# Patient Record
Sex: Female | Born: 1935 | Race: Black or African American | Hispanic: No | Marital: Married | Smoking: Former smoker
Health system: Southern US, Community
[De-identification: ages and names within clinical notes are randomized; demographics above are authoritative.]

## PROBLEM LIST (undated history)

## (undated) DIAGNOSIS — F329 Major depressive disorder, single episode, unspecified: Secondary | ICD-10-CM

## (undated) DIAGNOSIS — H269 Unspecified cataract: Secondary | ICD-10-CM

## (undated) DIAGNOSIS — F32A Depression, unspecified: Secondary | ICD-10-CM

## (undated) DIAGNOSIS — G309 Alzheimer's disease, unspecified: Secondary | ICD-10-CM

## (undated) DIAGNOSIS — G459 Transient cerebral ischemic attack, unspecified: Secondary | ICD-10-CM

## (undated) DIAGNOSIS — F0280 Dementia in other diseases classified elsewhere without behavioral disturbance: Secondary | ICD-10-CM

## (undated) DIAGNOSIS — I340 Nonrheumatic mitral (valve) insufficiency: Secondary | ICD-10-CM

## (undated) HISTORY — DX: Dementia in other diseases classified elsewhere, unspecified severity, without behavioral disturbance, psychotic disturbance, mood disturbance, and anxiety: F02.80

## (undated) HISTORY — DX: Nonrheumatic mitral (valve) insufficiency: I34.0

## (undated) HISTORY — PX: APPENDECTOMY: SHX54

## (undated) HISTORY — PX: BLADDER REPAIR: SHX76

## (undated) HISTORY — PX: TONSILLECTOMY: SUR1361

## (undated) HISTORY — PX: ABDOMINAL HYSTERECTOMY: SHX81

## (undated) HISTORY — DX: Major depressive disorder, single episode, unspecified: F32.9

## (undated) HISTORY — DX: Transient cerebral ischemic attack, unspecified: G45.9

## (undated) HISTORY — DX: Alzheimer's disease, unspecified: G30.9

## (undated) HISTORY — DX: Unspecified cataract: H26.9

## (undated) HISTORY — DX: Depression, unspecified: F32.A

---

## 2009-07-02 LAB — PROTIME-INR

## 2009-08-13 LAB — HM MAMMOGRAPHY: HM Mammogram: NEGATIVE

## 2010-01-23 LAB — PROTIME-INR

## 2010-07-16 LAB — PROTIME-INR

## 2010-09-16 LAB — PROTIME-INR

## 2011-03-17 LAB — PROTIME-INR

## 2011-04-17 LAB — PROTIME-INR

## 2011-08-21 LAB — PROTIME-INR

## 2012-06-07 LAB — PROTIME-INR

## 2013-10-30 LAB — PROTIME-INR

## 2013-12-22 LAB — PROTIME-INR

## 2014-02-28 LAB — PROTIME-INR

## 2014-03-20 LAB — PROTIME-INR

## 2014-04-11 LAB — PROTIME-INR

## 2014-05-25 LAB — PROTIME-INR

## 2014-06-07 LAB — PROTIME-INR

## 2014-08-20 LAB — PROTIME-INR

## 2014-09-11 LAB — PROTIME-INR

## 2014-10-02 LAB — PROTIME-INR

## 2014-11-14 LAB — PROTIME-INR: Protime: 37.5 seconds — AB (ref 10.0–13.8)

## 2014-11-15 ENCOUNTER — Telehealth: Payer: Self-pay | Admitting: *Deleted

## 2014-11-15 NOTE — Telephone Encounter (Signed)
Aram BeechamCynthia has addressed this.

## 2014-11-15 NOTE — Telephone Encounter (Signed)
Malyssa, daughter called regarding her mother. She stated that they have moved here from [place] and had her Coumadin drawn at a lab and are sending it to you. Informed her that we could not address until we saw the patient because she has not established with us yet. Offered another appointment with another Provider and she does not want to see another Dr. She wants to keep 01/28/15 with Dr. Renato Gailseed. Please Advise.

## 2014-11-21 ENCOUNTER — Ambulatory Visit: Payer: Self-pay | Admitting: Internal Medicine

## 2014-11-28 ENCOUNTER — Encounter: Payer: Self-pay | Admitting: Internal Medicine

## 2014-11-28 ENCOUNTER — Ambulatory Visit (INDEPENDENT_AMBULATORY_CARE_PROVIDER_SITE_OTHER): Payer: Federal, State, Local not specified - PPO | Admitting: Internal Medicine

## 2014-11-28 VITALS — BP 90/60 | HR 107 | Temp 98.5°F | Resp 18 | Ht 63.39 in | Wt 138.4 lb

## 2014-11-28 DIAGNOSIS — I340 Nonrheumatic mitral (valve) insufficiency: Secondary | ICD-10-CM

## 2014-11-28 DIAGNOSIS — Z7901 Long term (current) use of anticoagulants: Secondary | ICD-10-CM

## 2014-11-28 DIAGNOSIS — R630 Anorexia: Secondary | ICD-10-CM

## 2014-11-28 DIAGNOSIS — I82409 Acute embolism and thrombosis of unspecified deep veins of unspecified lower extremity: Secondary | ICD-10-CM

## 2014-11-28 NOTE — Progress Notes (Signed)
Patient ID: Faith Ortiz, female   DOB: November 10, 1932, 80 y.o.   MRN: [id number]     Facility  [facility]    Place of Service:   OFFICE   Allergies  Allergen Reactions  . Contrast Media [Iodinated Diagnostic Agents] Other (See Comments)    Weakness,   . Darvon [Propoxyphene] Other (See Comments)    Weakness, Fainting  . Penicillins Rash    Chief Complaint  Patient presents with  . Acute Visit    dizzy spells, weeakness , not wantint to get out of bed    HPI:  80 yo female presents today for an acute visit. She takes coumadin for hx recurrent DVTs. She had an INR drawn last week at [facility] that was >3. No foods high in Vit K. Feels nauseated and tired. No SOB. Daughter present.  Daughter reports pt has chronic dizziness, weakness and fatigue.  Medications: Patient's Medications  New Prescriptions   No medications on file  Previous Medications   DULOXETINE (CYMBALTA) 60 MG CAPSULE    Take 60 mg by mouth daily.   MEMANTINE (NAMENDA) 10 MG TABLET    Take 10 mg by mouth 2 (two) times daily.   POTASSIUM CHLORIDE (KLOR-CON) 8 MEQ TABLET    Take 8 mEq by mouth daily.   SIMVASTATIN (ZOCOR) 10 MG TABLET    Take 10 mg by mouth daily.   TRAZODONE (DESYREL) 150 MG TABLET    Take 150 mg by mouth at bedtime.   WARFARIN (COUMADIN) 4 MG TABLET    Take 4 mg by mouth at bedtime.  Modified Medications   No medications on file  Discontinued Medications   No medications on file     Review of Systems   As above. All other systems negative.  Filed Vitals:   11/28/14 1007  BP: 90/60  Pulse: 107  Temp: 98.5 F (36.9 C)  TempSrc: Oral  Resp: 18  Height: 5' 3.39" (1.61 m)  Weight: 138 lb 6.4 oz (62.778 kg)  SpO2: 98%   Body mass index is 24.22 kg/(m^2).  Physical Exam CONSTITUTIONAL: Looks well in NAD. Awake, alert and oriented x 3. Daughter present HEENT: PERRLA. Oropharynx clear and without exudate NECK: Supple. Nontender. No palpable cervical or supraclavicular lymph nodes. (+)carotid  bruit b/l. No thyromegaly or thyroid mass palpable.  CVS: Regular rate with frequent ectopy with 2/6 SEmurmur. No gallop or rub. LUNGS: CTA b/l no wheezing, rales or rhonchi. ABDOMEN: Bowel sounds present x 4. Soft, nondistended. No palpable mass or bruit. LLQ TTP but no r/g/r EXTREMITIES: No edema b/l. Distal pulses palpable. No calf tenderness. (+) right calf fullness. PSYCH: Affect, behavior and mood normal   Labs reviewed: No results found for any previous visit.   Assessment/Plan    ICD-9-CM ICD-10-CM   1. DVT, lower extremity, recurrent, unspecified laterality 453.40 I82.409 CBC with Differential     Protime-INR  2. Chronic anticoagulation V58.61 Z79.01 CBC with Differential  3. Mitral valve regurgitation 424.0 I34.0   4. Decreased appetite 783.0 R63.0    - check INR and CBC w diff today. Dose coumadin accordingly  - no other medication changes  -f/u with Dr Renato Gailseed as scheduled in Feb 2016    [facility]. Ancil Linseyarter, D. O., F. A. C. O. I.  [facility] [address] [place], [place] [address] [phone] Office (Wednesdays and Fridays 8 AM - 5 PM) [phone] Cell (Monday-Friday 8 AM - 5 PM)

## 2014-11-28 NOTE — Patient Instructions (Signed)
Follow up with Dr Renato Gailseed as scheduled  Continue all other medications as prescribed

## 2014-11-29 LAB — CBC WITH DIFFERENTIAL/PLATELET
Basophils Absolute: 0 10*3/uL (ref 0.0–0.2)
Basos: 0 %
EOS: 2 %
Eosinophils Absolute: 0.1 10*3/uL (ref 0.0–0.4)
HEMATOCRIT: 30.1 % — AB (ref 34.0–46.6)
HEMOGLOBIN: 9.8 g/dL — AB (ref 11.1–15.9)
IMMATURE GRANULOCYTES: 0 %
Immature Grans (Abs): 0 10*3/uL (ref 0.0–0.1)
LYMPHS ABS: 2 10*3/uL (ref 0.7–3.1)
Lymphs: 30 %
MCH: 28.2 pg (ref 26.6–33.0)
MCHC: 32.6 g/dL (ref 31.5–35.7)
MCV: 87 fL (ref 79–97)
MONOS ABS: 0.4 10*3/uL (ref 0.1–0.9)
Monocytes: 6 %
Neutrophils Absolute: 4 10*3/uL (ref 1.4–7.0)
Neutrophils Relative %: 62 %
RBC: 3.48 x10E6/uL — ABNORMAL LOW (ref 3.77–5.28)
RDW: 13.4 % (ref 12.3–15.4)
WBC: 6.5 10*3/uL (ref 3.4–10.8)

## 2014-11-29 LAB — PROTIME-INR
INR: 3.4 — AB (ref 0.8–1.2)
Prothrombin Time: 35.8 s — ABNORMAL HIGH (ref 9.1–12.0)

## 2014-12-03 ENCOUNTER — Other Ambulatory Visit: Payer: Self-pay | Admitting: *Deleted

## 2014-12-03 LAB — POCT INR: INR: 3.4 — AB (ref 0.9–1.1)

## 2014-12-03 MED ORDER — WARFARIN SODIUM 3 MG PO TABS: ORAL_TABLET | ORAL | Status: DC

## 2014-12-03 NOTE — Telephone Encounter (Signed)
Protime Results from [facility] 12/03/2014  INR:  3.4 PT:  37.5  Current Dose of Coumadin:   once daily Per Jessica---Hold today and restart  tomorrow and see Lynden Ang on Monday for repeat Patient daughter Notified and appointment scheduled for Monday 1/11 to see [facility].

## 2014-12-05 ENCOUNTER — Other Ambulatory Visit: Payer: Self-pay | Admitting: *Deleted

## 2014-12-05 MED ORDER — SIMVASTATIN 10 MG PO TABS: ORAL_TABLET | ORAL | Status: DC

## 2014-12-05 NOTE — Telephone Encounter (Signed)
[facility] 

## 2014-12-06 ENCOUNTER — Other Ambulatory Visit: Payer: Self-pay

## 2014-12-07 ENCOUNTER — Other Ambulatory Visit: Payer: Federal, State, Local not specified - PPO

## 2014-12-07 ENCOUNTER — Telehealth: Payer: Self-pay | Admitting: *Deleted

## 2014-12-07 DIAGNOSIS — I82409 Acute embolism and thrombosis of unspecified deep veins of unspecified lower extremity: Secondary | ICD-10-CM

## 2014-12-07 DIAGNOSIS — Z7901 Long term (current) use of anticoagulants: Secondary | ICD-10-CM

## 2014-12-07 LAB — PROTIME-INR
INR: 1.8 — AB (ref 0.8–1.2)
PROTHROMBIN TIME: 19 s — AB (ref 9.1–12.0)
Protime: 19 seconds — AB (ref 10.0–13.8)

## 2014-12-07 LAB — POCT INR: INR: 1.8 — AB (ref 0.9–1.1)

## 2014-12-07 NOTE — Telephone Encounter (Signed)
[facility] Protime Results from 12/07/2014  INR:  1.8 PT:  19.0  Current Dose of Coumadin: 3mg  daily Per Dr. Gar Pontoarter--Take 6mg  Coumadin today and tomorrow then resume 3mg  dose on 12/09/14. Recheck INR on Monday 12/10/2014. Patient daughter Notified and agreed and confirmed Monday's appointment with [name].

## 2014-12-10 ENCOUNTER — Encounter: Payer: Self-pay | Admitting: Pharmacotherapy

## 2014-12-10 ENCOUNTER — Ambulatory Visit (INDEPENDENT_AMBULATORY_CARE_PROVIDER_SITE_OTHER): Payer: Federal, State, Local not specified - PPO | Admitting: Pharmacotherapy

## 2014-12-10 VITALS — BP 120/74 | HR 74 | Temp 98.1°F | Wt 137.0 lb

## 2014-12-10 DIAGNOSIS — I82409 Acute embolism and thrombosis of unspecified deep veins of unspecified lower extremity: Secondary | ICD-10-CM

## 2014-12-10 DIAGNOSIS — Z5181 Encounter for therapeutic drug level monitoring: Secondary | ICD-10-CM

## 2014-12-10 DIAGNOSIS — IMO0001 Reserved for inherently not codable concepts without codable children: Secondary | ICD-10-CM | POA: Insufficient documentation

## 2014-12-10 DIAGNOSIS — Z7901 Long term (current) use of anticoagulants: Secondary | ICD-10-CM

## 2014-12-10 DIAGNOSIS — I82509 Chronic embolism and thrombosis of unspecified deep veins of unspecified lower extremity: Secondary | ICD-10-CM

## 2014-12-10 LAB — POCT INR: INR: 2.3

## 2014-12-10 MED ORDER — WARFARIN SODIUM 4 MG PO TABS
4.0000 mg | ORAL_TABLET | Freq: Every day | ORAL | Status: DC
Start: 2014-12-10 — End: 2015-06-07

## 2014-12-10 NOTE — Progress Notes (Signed)
   Subjective:    Patient ID: Faith Ortiz, female    DOB: 26-Jul-1932, 81 y.o.   MRN: [id number]  HPI Last INR on 12/07/14 was low at 1.8 Coumadin was doubled x 2 days, then she returned for INR check. She had been stable on 4mg  daily while living in [place]. Denies unusual bleeding or bruising. Denies CP, SOB, fall Consistent with vitamin K intake.   Review of Systems  HENT: Negative for nosebleeds.   Respiratory: Negative for shortness of breath.   Cardiovascular: Negative for chest pain.  Gastrointestinal: Negative for blood in stool and anal bleeding.  Genitourinary: Negative for hematuria.  Hematological: Does not bruise/bleed easily.       Objective:   Physical Exam  Constitutional: She is oriented to person, place, and time. She appears well-developed and well-nourished.  HENT:  Right Ear: External ear normal.  Left Ear: External ear normal.  Cardiovascular: Normal rate, regular rhythm and normal heart sounds.   Pulmonary/Chest: Effort normal and breath sounds normal.  Neurological: She is alert and oriented to person, place, and time.  Skin: Skin is warm and dry.  Psychiatric: She has a normal mood and affect. Her behavior is normal. Judgment and thought content normal.  Vitals reviewed.   BP:  120/74  HR:  74,  Wt:  137lb  INR 2.3       Assessment & Plan:  1.  INR at goal 2-3 2.  Restart Coumadin 4mg  daily 3.  RTC 1 week

## 2014-12-10 NOTE — Patient Instructions (Signed)
INR 2.3 Start Coumadin 4mg  daily

## 2014-12-17 ENCOUNTER — Encounter: Payer: Self-pay | Admitting: Internal Medicine

## 2014-12-17 ENCOUNTER — Ambulatory Visit: Payer: Federal, State, Local not specified - PPO | Admitting: Pharmacotherapy

## 2014-12-24 ENCOUNTER — Ambulatory Visit (INDEPENDENT_AMBULATORY_CARE_PROVIDER_SITE_OTHER): Payer: Federal, State, Local not specified - PPO | Admitting: Pharmacotherapy

## 2014-12-24 ENCOUNTER — Encounter: Payer: Self-pay | Admitting: Pharmacotherapy

## 2014-12-24 VITALS — BP 122/70 | HR 84 | Temp 98.4°F | Wt 136.0 lb

## 2014-12-24 DIAGNOSIS — I82409 Acute embolism and thrombosis of unspecified deep veins of unspecified lower extremity: Secondary | ICD-10-CM

## 2014-12-24 DIAGNOSIS — I82509 Chronic embolism and thrombosis of unspecified deep veins of unspecified lower extremity: Secondary | ICD-10-CM

## 2014-12-24 DIAGNOSIS — Z5181 Encounter for therapeutic drug level monitoring: Secondary | ICD-10-CM

## 2014-12-24 DIAGNOSIS — IMO0001 Reserved for inherently not codable concepts without codable children: Secondary | ICD-10-CM

## 2014-12-24 DIAGNOSIS — Z7901 Long term (current) use of anticoagulants: Secondary | ICD-10-CM

## 2014-12-24 LAB — POCT INR: INR: 2.1

## 2014-12-24 NOTE — Progress Notes (Signed)
   Subjective:    Patient ID: Faith Ortiz, female    DOB: 1932-04-04, 81 y.o.   MRN: [id number]  HPI Her current Coumadin dose is 4mg  daily. Denies missed doses. Denies unusual bleeding or bruising. Denies CP, SOB, falls Consistent with vitamin K intake   Review of Systems  HENT: Negative for nosebleeds.   Respiratory: Negative for shortness of breath.   Cardiovascular: Negative for chest pain and palpitations.  Gastrointestinal: Negative for blood in stool and anal bleeding.  Genitourinary: Negative for hematuria.  Hematological: Does not bruise/bleed easily.       Objective:   Physical Exam  Constitutional: She is oriented to person, place, and time. She appears well-developed and well-nourished.  HENT:  Right Ear: External ear normal.  Left Ear: External ear normal.  Cardiovascular: Normal rate, regular rhythm and normal heart sounds.   Pulmonary/Chest: Effort normal and breath sounds normal.  Neurological: She is alert and oriented to person, place, and time.  Skin: Skin is warm and dry.  Psychiatric: She has a normal mood and affect. Her behavior is normal. Judgment and thought content normal.  Vitals reviewed.  BP:  122/70  HR  84  Wt:  136lb INR 2.1       Assessment & Plan:  1.  INR at goal 2-3 2.  Continue Coumadin 4mg  daily 3.  RTC in 1 month

## 2014-12-24 NOTE — Patient Instructions (Signed)
INR 2.1 Continue Coumadin 4mg  daily.

## 2015-01-24 ENCOUNTER — Encounter: Payer: Self-pay | Admitting: Internal Medicine

## 2015-01-28 ENCOUNTER — Ambulatory Visit: Payer: Self-pay | Admitting: Internal Medicine

## 2015-02-04 ENCOUNTER — Encounter: Payer: Self-pay | Admitting: Internal Medicine

## 2015-02-04 ENCOUNTER — Ambulatory Visit (INDEPENDENT_AMBULATORY_CARE_PROVIDER_SITE_OTHER): Payer: Federal, State, Local not specified - PPO | Admitting: Internal Medicine

## 2015-02-04 VITALS — BP 122/74 | HR 84 | Temp 98.2°F | Resp 18 | Ht 63.3 in | Wt 135.0 lb

## 2015-02-04 DIAGNOSIS — F0280 Dementia in other diseases classified elsewhere without behavioral disturbance: Secondary | ICD-10-CM

## 2015-02-04 DIAGNOSIS — E2839 Other primary ovarian failure: Secondary | ICD-10-CM | POA: Diagnosis not present

## 2015-02-04 DIAGNOSIS — Z8673 Personal history of transient ischemic attack (TIA), and cerebral infarction without residual deficits: Secondary | ICD-10-CM

## 2015-02-04 DIAGNOSIS — R630 Anorexia: Secondary | ICD-10-CM | POA: Diagnosis not present

## 2015-02-04 DIAGNOSIS — E785 Hyperlipidemia, unspecified: Secondary | ICD-10-CM | POA: Insufficient documentation

## 2015-02-04 DIAGNOSIS — I340 Nonrheumatic mitral (valve) insufficiency: Secondary | ICD-10-CM

## 2015-02-04 DIAGNOSIS — I82509 Chronic embolism and thrombosis of unspecified deep veins of unspecified lower extremity: Secondary | ICD-10-CM | POA: Diagnosis not present

## 2015-02-04 DIAGNOSIS — Z23 Encounter for immunization: Secondary | ICD-10-CM

## 2015-02-04 DIAGNOSIS — G309 Alzheimer's disease, unspecified: Secondary | ICD-10-CM | POA: Diagnosis not present

## 2015-02-04 DIAGNOSIS — Z5181 Encounter for therapeutic drug level monitoring: Secondary | ICD-10-CM | POA: Diagnosis not present

## 2015-02-04 DIAGNOSIS — G4700 Insomnia, unspecified: Secondary | ICD-10-CM | POA: Diagnosis not present

## 2015-02-04 DIAGNOSIS — E079 Disorder of thyroid, unspecified: Secondary | ICD-10-CM | POA: Diagnosis not present

## 2015-02-04 LAB — POCT INR: INR: 2.9

## 2015-02-04 MED ORDER — ZOSTER VACCINE LIVE 19400 UNT/0.65ML ~~LOC~~ SOLR: 0.6500 mL | Freq: Once | SUBCUTANEOUS | Status: DC

## 2015-02-04 NOTE — Patient Instructions (Addendum)
Please obtain copy of the colonoscopy report from 10 years ago.    Please get a zostavax vaccination at the pharmacy.  Please bring us a copy of your health care power of attorney/living will paperwork.

## 2015-02-04 NOTE — Progress Notes (Signed)
Patient ID: Faith Ortiz, female   DOB: 1932-10-15, 81 y.o.   MRN: [id number]   Location:  [facility] [place]  Code Status: full code Advanced Directive information Does patient have an advance directive?: No   Allergies  Allergen Reactions  . Codeine   . Contrast Media [Iodinated Diagnostic Agents] Other (See Comments)    Weakness,   . Darvon [Propoxyphene] Other (See Comments)    Weakness, Fainting  . Wine [Alcohol]   . Penicillins Rash    Chief Complaint  Patient presents with  . Establish Care    New patient establish care  . Shortness of Breath    patient complain of shortness of breath  . Coagulation Disorder    PT/INR check     HPI: Patient is a 81 y.o. female seen in the office today to establish with the practice.  She has h/o Alzheimer's disease, mitral regurgitation, recurrent DVT on coumadin with INR goal 2-3, and depression.  INR 2.9 today--at goal.  Will plan to check again in 1 month.    Some short term memory loss.  Lives with her daughter--came 10/05/14.  She helps her to get her food, medications in pillbox.  Showers on her own and gets dressed.  Sometimes her daughter helps her get her clothes together when she is out of breath due to the leaky mitral valve.   Surgery was recommended 2 years ago for leaky valve, but DR. Zoila Shutter and family decided that it was not in her best interest to have such a surgery.  Walking will get her sob, getting dressed--has to sit down.  Does not exercise aside from walking.  Her daughter points out that she likes to sit in bed all day.  They have their coffee in the recliner, then walk around the living room a few times to get going.  They couldn't get the namenda XR so far.  They do not recall her ever taking donepezil.    Had incident 4 days ago, went unresponsive--stopped talking suddenly, head tilted back and eyes rolled back in head.  Came to after ems came.  Is allergic to wine and she had  eaten shrimp cooked in wine.  O2 levels were normal.  Has no appetite--takes boost twice a day for 7 years.  Loves lobster bisque, chicken wing here and there.  Fruit smoothies.  Drinks a lot of chocolate milk which fills her up.  Eats PB and crackers twice a month.    Mood is good.  Has been on cymbalta also for a long time.    Does not get chest pains.  No sensation of bad indigestion.   Had cataracts done about a year ago.  Had left eye checked. Doesn't run a lot, but some and she rubs it.  Has not yet established with ophthalmologist.     Has been on same cholesterol med.  Had a "mini stroke" 10-15 years ago--sounds like she had a TIA.  They don't recall what symptoms she had.  She's been on coumadin since then.  No h/o afib.  She is 16th of 18 siblings. Is a homebody.    Would not want intervention if she had breast cancer discovered.    Had a cscope 10 years ago.  Says she has formed stools.  We need the report.  Review of Systems:  Review of Systems  Constitutional: Negative for fever, chills, weight loss and diaphoresis.       Poor appetite  HENT:       Has partial and currently has loose tooth they are considering getting extracted  Eyes:       Left eye cataract removed in 2015 and now runny eye  Respiratory: Positive for shortness of breath. Negative for cough and wheezing.   Cardiovascular: Negative for chest pain, palpitations, leg swelling and PND.       Leaky mitral valve--last echo in chart 2012  Gastrointestinal: Negative for constipation, blood in stool and melena.       H/o colitis  Genitourinary: Negative for dysuria, urgency and frequency.  Musculoskeletal: Negative for falls.  Skin: Negative for itching and rash.  Neurological:       H/o TIA  Endo/Heme/Allergies:       Agranular cytosis?  Psychiatric/Behavioral: Positive for depression and memory loss. The patient is nervous/anxious and has insomnia.        Confusion; is afraid to be home alone due to her  husband being murdered    Past Medical History  Diagnosis Date  . Cataract 2011-2013    left  . Alzheimer disease   . Mitral regurgitation   . Depression   . TIA (transient ischemic attack)     Past Surgical History  Procedure Laterality Date  . Abdominal hysterectomy    . Bladder repair    . Cesarean section    . Cesarean section    . Cesarean section    . Tonsillectomy    . Appendectomy      Social History:   reports that she has quit smoking. Her smoking use included Cigarettes. She smoked 0.00 packs per day for 5 years. She has never used smokeless tobacco. She reports that she does not drink alcohol or use illicit drugs.  Family History  Problem Relation Age of Onset  . Diabetes Father   . Arthritis Mother   . Breast cancer Sister   . Breast cancer Sister   . Stroke Sister   . Diabetes Brother   . Lung disease Brother   . Sarcoidosis Son 1762  . Arthritis Son 2460  . Gout Son 60  . Diabetes Daughter 2259    Medications: Patient's Medications  New Prescriptions   No medications on file  Previous Medications   DULOXETINE (CYMBALTA) 60 MG CAPSULE    Take 60 mg by mouth daily.   MEMANTINE (NAMENDA) 10 MG TABLET    Take 10 mg by mouth 2 (two) times daily.   POTASSIUM CHLORIDE (KLOR-CON) 8 MEQ TABLET    Take 8 mEq by mouth daily.   SIMVASTATIN (ZOCOR) 10 MG TABLET    Take one tablet by mouth once daily for cholesterol   TRAZODONE (DESYREL) 150 MG TABLET    Take 150 mg by mouth at bedtime.   WARFARIN (COUMADIN) 4 MG TABLET    Take 1 tablet (4 mg total) by mouth daily.  Modified Medications   No medications on file  Discontinued Medications   No medications on file     Physical Exam: Filed Vitals:   02/04/15 0849  BP: 122/74  Pulse: 84  Temp: 98.2 F (36.8 C)  Resp: 18  Height: 5' 3.3" (1.608 m)  Weight: 135 lb (61.236 kg)  SpO2: 98%  Physical Exam  Constitutional: She is oriented to person, place, and time. She appears well-developed and well-nourished.  No distress.  Cardiovascular: Normal rate, regular rhythm and intact distal pulses.   Murmur heard. Pulmonary/Chest: Effort normal and breath sounds normal. She has no rales.  Abdominal: Soft. Bowel sounds are normal. She exhibits no distension and no mass. There is no tenderness.  Musculoskeletal: Normal range of motion. She exhibits no tenderness.  Neurological: She is alert and oriented to person, place, and time.  Skin: Skin is warm and dry.  Psychiatric: She has a normal mood and affect.    Labs reviewed: CBC:  Recent Labs  11/28/14 1101  WBC 6.5  NEUTROABS 4.0  HGB 9.8*  HCT 30.1*  MCV 87   Assessment/Plan 1. Chronic deep vein thrombosis of lower extremity, unspecified laterality - initial diagnosis date unclear--pt has been on coumadin since she had a TIA, but is in sinus rhythm now and on EKGs on record - POC INR - CBC with Differential/Platelet; Future - Protime-INR; Future  2. Encounter for therapeutic drug monitoring - will have her return for labs with INR in 1 month; today INR 2.9 on  coumadin daily - POC INR - Protime-INR; Future  3. Decreased appetite - has been going on for several years, takes boost and smoothies, some other rich items like lobster bisque - Comprehensive metabolic panel; Future - Prealbumin; Future  4. Alzheimer disease -cont namenda--her pharmacy did not have XR which is the only reason she is on bid namenda -due to her appetite and weight loss historically, would avoid aricept in her  5. Mitral regurgitation -has some dyspnea on exertion related to this -last echo was 2012 and stable -discussed repeat, but will not change mgt b/c she does not want surgery -POX checked seated (98%) and with exertion (96%)  6. Hyperlipidemia - cont statin therapy - Comprehensive metabolic panel; Future - Lipid panel; Future  7. H/O TIA (transient ischemic attack)  -pt and her daughter are unable to tell what her symptoms were at the time  just that there were no residual neurological deficits  8. Insomnia -cont trazodone  at bedtime  9. Need for zoster vaccination -Rx provided to get vaccine at the pharmacy - zoster vaccine live, PF, (ZOSTAVAX) [address] UNT/0.65ML injection; Inject 19,400 Units into the skin once.  Dispense: 1 each; Refill: 0  10. Estrogen deficiency -will obtain bone density and vitamin D level--she has not had the bone density ever before that they know of and not seen in records - Vitamin D, 25-hydroxy; Future - DG Bone Density; Future  11. Thyroid condition -pt thinks she was hyperthyroid in the past so will reassess TSH - TSH; Future  Labs/tests ordered:   Orders Placed This Encounter  Procedures  . DG Bone Density    Standing Status: Future     Number of Occurrences:      Standing Expiration Date: 04/05/2016    Order Specific Question:  Reason for Exam (SYMPTOM  OR DIAGNOSIS REQUIRED)    Answer:  screening; estrogen deficiency; postmenopausal    Order Specific Question:  Preferred imaging location?    Answer:  [facility]  . CBC with Differential/Platelet    Standing Status: Future     Number of Occurrences:      Standing Expiration Date: 05/07/2015  . Comprehensive metabolic panel    Standing Status: Future     Number of Occurrences:      Standing Expiration Date: 05/07/2015    Order Specific Question:  Has the patient fasted?    Answer:  Yes  . Lipid panel    Standing Status: Future     Number of Occurrences:      Standing Expiration Date: 05/07/2015    Order Specific Question:  Has the patient fasted?    Answer:  Yes  . Vitamin D, 25-hydroxy    Standing Status: Future     Number of Occurrences:      Standing Expiration Date: 05/07/2015  . Prealbumin    Standing Status: Future     Number of Occurrences:      Standing Expiration Date: 05/07/2015  . TSH    Standing Status: Future     Number of Occurrences:      Standing Expiration Date: 05/07/2015  . Protime-INR    Standing  Status: Future     Number of Occurrences:      Standing Expiration Date: 02/04/2016  . POC INR    Next appt:  1 month for labs; 3 mos for visit with me  Faith Ortiz, D.O. Geriatrics [facility] [address], [place] [address] Cell Phone (Mon-Fri 8am-5pm):  [phone] On Call:  [phone] & follow prompts after 5pm & weekends Office Phone:  [phone] Office Fax:  [phone]

## 2015-02-28 ENCOUNTER — Ambulatory Visit
Admission: RE | Admit: 2015-02-28 | Discharge: 2015-02-28 | Disposition: A | Discharge status: Home / Self Care | Payer: Federal, State, Local not specified - PPO | Source: Ambulatory Visit | Attending: Internal Medicine | Admitting: Internal Medicine

## 2015-02-28 ENCOUNTER — Other Ambulatory Visit: Payer: Federal, State, Local not specified - PPO

## 2015-02-28 DIAGNOSIS — E2839 Other primary ovarian failure: Secondary | ICD-10-CM

## 2015-03-07 ENCOUNTER — Other Ambulatory Visit: Payer: Self-pay | Admitting: *Deleted

## 2015-03-07 MED ORDER — MEMANTINE HCL 10 MG PO TABS: ORAL_TABLET | ORAL | Status: DC

## 2015-03-07 NOTE — Telephone Encounter (Signed)
[facility] Order

## 2015-03-08 ENCOUNTER — Other Ambulatory Visit: Payer: Self-pay | Admitting: *Deleted

## 2015-03-08 MED ORDER — MEMANTINE HCL 10 MG PO TABS: ORAL_TABLET | ORAL | Status: DC

## 2015-03-08 NOTE — Telephone Encounter (Signed)
Patient requested to be faxed to [facility]. Faxed

## 2015-03-11 ENCOUNTER — Other Ambulatory Visit: Payer: Self-pay | Admitting: Internal Medicine

## 2015-03-11 ENCOUNTER — Other Ambulatory Visit: Payer: Federal, State, Local not specified - PPO

## 2015-03-11 NOTE — Telephone Encounter (Signed)
Patient requested to be faxed to pharmacy. Reminded patient of Lab appointment tomorrow.

## 2015-03-12 ENCOUNTER — Other Ambulatory Visit: Payer: Federal, State, Local not specified - PPO

## 2015-03-12 DIAGNOSIS — I82509 Chronic embolism and thrombosis of unspecified deep veins of unspecified lower extremity: Secondary | ICD-10-CM

## 2015-03-12 DIAGNOSIS — R630 Anorexia: Secondary | ICD-10-CM

## 2015-03-12 DIAGNOSIS — E2839 Other primary ovarian failure: Secondary | ICD-10-CM

## 2015-03-12 DIAGNOSIS — E785 Hyperlipidemia, unspecified: Secondary | ICD-10-CM

## 2015-03-12 DIAGNOSIS — Z5181 Encounter for therapeutic drug level monitoring: Secondary | ICD-10-CM

## 2015-03-12 DIAGNOSIS — E079 Disorder of thyroid, unspecified: Secondary | ICD-10-CM

## 2015-03-13 LAB — COMPREHENSIVE METABOLIC PANEL
ALT: 12 IU/L (ref 0–32)
AST: 23 IU/L (ref 0–40)
Albumin/Globulin Ratio: 1.5 (ref 1.1–2.5)
Albumin: 4 g/dL (ref 3.5–4.7)
Alkaline Phosphatase: 61 IU/L (ref 39–117)
BUN/Creatinine Ratio: 16 (ref 11–26)
BUN: 12 mg/dL (ref 8–27)
Bilirubin Total: <0.2 mg/dL (ref 0.0–1.2)
CO2: 21 mmol/L (ref 18–29)
Calcium: 9.2 mg/dL (ref 8.7–10.3)
Chloride: 101 mmol/L (ref 97–108)
Creatinine, Ser: 0.76 mg/dL (ref 0.57–1.00)
GFR calc Af Amer: 85 mL/min/{1.73_m2} (ref >59)
GFR calc non Af Amer: 74 mL/min/{1.73_m2} (ref >59)
Globulin, Total: 2.7 g/dL (ref 1.5–4.5)
Glucose: 125 mg/dL — ABNORMAL HIGH (ref 65–99)
Potassium: 3.6 mmol/L (ref 3.5–5.2)
Sodium: 140 mmol/L (ref 134–144)
Total Protein: 6.7 g/dL (ref 6.0–8.5)

## 2015-03-13 LAB — CBC WITH DIFFERENTIAL/PLATELET
Basophils Absolute: 0 10*3/uL (ref 0.0–0.2)
Basos: 0 %
Eos: 3 %
Eosinophils Absolute: 0.1 10*3/uL (ref 0.0–0.4)
HCT: 34.2 % (ref 34.0–46.6)
Hemoglobin: 10.1 g/dL — ABNORMAL LOW (ref 11.1–15.9)
Immature Grans (Abs): 0 10*3/uL (ref 0.0–0.1)
Immature Granulocytes: 0 %
Lymphocytes Absolute: 1.7 10*3/uL (ref 0.7–3.1)
Lymphs: 52 %
MCH: 21.4 pg — ABNORMAL LOW (ref 26.6–33.0)
MCHC: 29.5 g/dL — ABNORMAL LOW (ref 31.5–35.7)
MCV: 72 fL — ABNORMAL LOW (ref 79–97)
Monocytes Absolute: 0.2 10*3/uL (ref 0.1–0.9)
Monocytes: 5 %
Neutrophils Absolute: 1.3 10*3/uL — ABNORMAL LOW (ref 1.4–7.0)
Neutrophils Relative %: 40 %
Platelets: 358 10*3/uL (ref 150–379)
RBC: 4.73 x10E6/uL (ref 3.77–5.28)
RDW: 17.6 % — ABNORMAL HIGH (ref 12.3–15.4)
WBC: 3.3 10*3/uL — ABNORMAL LOW (ref 3.4–10.8)

## 2015-03-13 LAB — LIPID PANEL
Chol/HDL Ratio: 3.1 ratio units (ref 0.0–4.4)
Cholesterol, Total: 202 mg/dL — ABNORMAL HIGH (ref 100–199)
HDL: 66 mg/dL (ref >39)
LDL Calculated: 108 mg/dL — ABNORMAL HIGH (ref 0–99)
Triglycerides: 139 mg/dL (ref 0–149)
VLDL Cholesterol Cal: 28 mg/dL (ref 5–40)

## 2015-03-13 LAB — PROTIME-INR
INR: 2.6 — ABNORMAL HIGH (ref 0.8–1.2)
Prothrombin Time: 27.5 s — ABNORMAL HIGH (ref 9.1–12.0)

## 2015-03-13 LAB — PREALBUMIN: Prealbumin: 21 mg/dL (ref 9–31)

## 2015-03-13 LAB — VITAMIN D 25 HYDROXY (VIT D DEFICIENCY, FRACTURES): Vit D, 25-Hydroxy: 29.9 ng/mL — ABNORMAL LOW (ref 30.0–100.0)

## 2015-03-13 LAB — TSH: TSH: 2.31 u[IU]/mL (ref 0.450–4.500)

## 2015-04-15 ENCOUNTER — Other Ambulatory Visit: Payer: Self-pay | Admitting: *Deleted

## 2015-04-15 MED ORDER — DULOXETINE HCL 60 MG PO CPEP: 60.0000 mg | ORAL_CAPSULE | Freq: Every day | ORAL | Status: DC

## 2015-05-09 ENCOUNTER — Encounter: Payer: Self-pay | Admitting: Internal Medicine

## 2015-05-09 ENCOUNTER — Ambulatory Visit: Payer: Federal, State, Local not specified - PPO | Admitting: Internal Medicine

## 2015-05-09 DIAGNOSIS — Z0289 Encounter for other administrative examinations: Secondary | ICD-10-CM

## 2015-05-28 ENCOUNTER — Other Ambulatory Visit: Payer: Self-pay | Admitting: *Deleted

## 2015-05-28 ENCOUNTER — Telehealth: Payer: Self-pay | Admitting: *Deleted

## 2015-05-28 ENCOUNTER — Other Ambulatory Visit: Payer: Federal, State, Local not specified - PPO

## 2015-05-28 DIAGNOSIS — I82409 Acute embolism and thrombosis of unspecified deep veins of unspecified lower extremity: Secondary | ICD-10-CM

## 2015-05-28 NOTE — Telephone Encounter (Signed)
Patient caregiver called and stated that patient needs a lab appointment for INR check. I checked patient's chart and she needs a appointment to be seen. Has canceled several appointment. Explained to her that we need to make an appointment for patient to be seen and she stated that all she needs is bloodwork. Told her that provider needs to follow up with patient. She agreed and made appointment but also wanted to go ahead and make a protime lab appointment also. Scheduled for both.

## 2015-05-29 LAB — PROTIME-INR
INR: 3.1 — ABNORMAL HIGH (ref 0.8–1.2)
Prothrombin Time: 32.5 s — ABNORMAL HIGH (ref 9.1–12.0)

## 2015-05-30 ENCOUNTER — Ambulatory Visit: Payer: Federal, State, Local not specified - PPO | Admitting: Internal Medicine

## 2015-05-31 ENCOUNTER — Encounter: Payer: Self-pay | Admitting: Internal Medicine

## 2015-05-31 ENCOUNTER — Ambulatory Visit (INDEPENDENT_AMBULATORY_CARE_PROVIDER_SITE_OTHER): Payer: Federal, State, Local not specified - PPO | Admitting: Internal Medicine

## 2015-05-31 VITALS — BP 110/80 | HR 91 | Temp 98.6°F | Resp 18 | Ht 63.0 in | Wt 130.4 lb

## 2015-05-31 DIAGNOSIS — G309 Alzheimer's disease, unspecified: Secondary | ICD-10-CM | POA: Diagnosis not present

## 2015-05-31 DIAGNOSIS — E785 Hyperlipidemia, unspecified: Secondary | ICD-10-CM | POA: Diagnosis not present

## 2015-05-31 DIAGNOSIS — F3341 Major depressive disorder, recurrent, in partial remission: Secondary | ICD-10-CM

## 2015-05-31 DIAGNOSIS — R630 Anorexia: Secondary | ICD-10-CM

## 2015-05-31 DIAGNOSIS — F0280 Dementia in other diseases classified elsewhere without behavioral disturbance: Secondary | ICD-10-CM

## 2015-05-31 DIAGNOSIS — I82509 Chronic embolism and thrombosis of unspecified deep veins of unspecified lower extremity: Secondary | ICD-10-CM

## 2015-05-31 NOTE — Progress Notes (Signed)
Patient ID: Faith Ortiz, female   DOB: 09-02-1932, 81 y.o.   MRN: [id number]   Location:  [facility]   Goals of Care: Advanced Directive information Does patient have an advance directive?: No, Would patient like information on creating an advanced directive?: Yes - Educational materials given (and they were working on it last visit; need to ask next time if completed so they can bring copy to be scanned)   Chief Complaint  Patient presents with  . Medical Management of Chronic Issues    HPI: Patient is a 81 y.o. black female seen in the office today for med mgt of chronic diseases.  Her daughter brought her in today.  Pt does not want the shingles vaccine.  Still has decreased appetite.  Has the two boosts per day.  Eats some chicken wings and vegetables and soft fish occasionally.  Has lost 5 more lbs since last visit.  Was drinking less chocolate milk and fewer strawberry shakes.  Has twice weekly smoothie with veggie.    Would sleep forever if she could. Remains depressed somewhat but much better than it once was.    Her daughter is now home  5 days per week. She seems to want to try changing her medications, but the patient wants to see if she loses more weight by the next visit.    Review of Systems:  Review of Systems  Constitutional: Positive for weight loss and malaise/fatigue. Negative for fever and chills.  HENT: Negative for congestion and hearing loss.   Eyes: Negative for blurred vision.  Respiratory: Negative for shortness of breath.   Cardiovascular: Negative for chest pain.  Gastrointestinal: Negative for abdominal pain.  Genitourinary: Negative for dysuria, urgency and frequency.  Musculoskeletal: Negative for myalgias and falls.  Skin: Negative for rash.  Neurological: Negative for dizziness, loss of consciousness and weakness.  Psychiatric/Behavioral: Positive for depression and memory loss. The patient has  insomnia.     Past Medical History  Diagnosis Date  . Cataract 2011-2013    left  . Alzheimer disease   . Mitral regurgitation   . Depression   . TIA (transient ischemic attack)     Past Surgical History  Procedure Laterality Date  . Abdominal hysterectomy    . Bladder repair    . Cesarean section    . Cesarean section    . Cesarean section    . Tonsillectomy    . Appendectomy      Allergies  Allergen Reactions  . Codeine   . Contrast Media [Iodinated Diagnostic Agents] Other (See Comments)    Weakness,   . Darvon [Propoxyphene] Other (See Comments)    Weakness, Fainting  . Wine [Alcohol]   . Penicillins Rash   Medications: Patient's Medications  New Prescriptions   No medications on file  Previous Medications   DULOXETINE (CYMBALTA) 60 MG CAPSULE    Take 1 capsule (60 mg total) by mouth daily.   KLOR-CON 8 MEQ TABLET    TAKE 1 TABLET EVERY DAY   MEMANTINE (NAMENDA) 10 MG TABLET    Take one tablet by mouth twice daily for memory   SIMVASTATIN (ZOCOR) 10 MG TABLET    Take one tablet by mouth once daily for cholesterol   TRAZODONE (DESYREL) 150 MG TABLET    Take 150 mg by mouth at bedtime.   WARFARIN (COUMADIN) 4 MG TABLET    Take 1 tablet (4 mg total) by mouth daily.   ZOSTER  VACCINE LIVE, PF, (ZOSTAVAX) 1610919400 UNT/0.65ML INJECTION    Inject 19,400 Units into the skin once.  Modified Medications   No medications on file  Discontinued Medications   No medications on file    Physical Exam: Filed Vitals:   05/31/15 1125  BP: 110/80  Pulse: 91  Temp: 98.6 F (37 C)  TempSrc: Oral  Resp: 18  Height: 5\' 3"  (1.6 m)  Weight: 130 lb 6.4 oz (59.149 kg)  SpO2: 96%   Physical Exam  Constitutional: She appears well-developed and well-nourished. No distress.  Cardiovascular: Normal rate, regular rhythm, normal heart sounds and intact distal pulses.   Pulmonary/Chest: Effort normal and breath sounds normal. No respiratory distress.  Abdominal: Soft. Bowel sounds  are normal. She exhibits no distension. There is no tenderness.  Musculoskeletal: Normal range of motion.  Neurological: She is alert.  Skin: Skin is warm and dry.    Labs reviewed: Basic Metabolic Panel:  Recent Labs  60/45/4003/11/15 1303  NA 140  K 3.6  CL 101  CO2 21  GLUCOSE 125*  BUN 12  CREATININE 0.76  CALCIUM 9.2  TSH 2.310   Liver Function Tests:  Recent Labs  03/12/15 1303  AST 23  ALT 12  ALKPHOS 61  BILITOT <0.2  PROT 6.7   No results for input(s): LIPASE, AMYLASE in the last 8760 hours. No results for input(s): AMMONIA in the last 8760 hours. CBC:  Recent Labs  11/28/14 1101 03/12/15 1303  WBC 6.5 3.3*  NEUTROABS 4.0 1.3*  HGB 9.8* 10.1*  HCT 30.1* 34.2  MCV 87 72*  PLT  --  358   Lipid Panel:  Recent Labs  03/12/15 1303  CHOL 202*  HDL 66  LDLCALC 108*  TRIG 139  CHOLHDL 3.1   Assessment/Plan 1. Major depressive disorder, recurrent episode, in partial remission -continues on cymbalta for this but still has some symptoms like eating poorly, sleeping too much -she is not ready to change her medication--consider adding remeron and if it helps maybe decreasing cymbalta which can affect appetite if she loses more weight   2. Alzheimer disease -is moderate to severe at this time with her failure to thrive from depression Would like to get her depression better treated to see if it doesn't help her cognition Cont namenda bid Avoid aricept due to poor appetite and weight loss  3. Chronic deep vein thrombosis of lower extremity, unspecified laterality -continues on coumadin -last INR 3.1 so advised to hold one dose and recheck in a week. - Protime-INR; Future  4. Decreased appetite -has not gotten any better in last month and lost 5 more lbs since cutting down on milkshakes -recommended remeron at bedtime--would probably need to reduce trazodone and cymbalta due to sleepiness side effect, but they didn't want to do this yet today  5.  Hyperlipidemia -continues on zocor 10mg  for this, they cut down on her milkshakes b/c of this, but I am not too concerned about this number with her weight loss and dementia progression  Labs/tests ordered:   Orders Placed This Encounter  Procedures  . Protime-INR    Standing Status: Future     Number of Occurrences:      Standing Expiration Date: 05/30/2016    Next appt:  INR in 1 wk, f/u 3 mos  Ayiden Milliman L. Josecarlos Harriott, D.O. Geriatrics [facility] [address] [place], [place] [address] Cell Phone (Mon-Fri 8am-5pm):  [phone] On Call:  [phone] & follow prompts after  5pm & weekends Office Phone:  [phone] Office Fax:  [phone]

## 2015-05-31 NOTE — Patient Instructions (Signed)
Hold coumadin tonight, then resume normal dose and return for recheck of INR in 1 week.

## 2015-06-05 ENCOUNTER — Other Ambulatory Visit: Payer: Self-pay | Admitting: Internal Medicine

## 2015-06-06 ENCOUNTER — Other Ambulatory Visit: Payer: Self-pay | Admitting: Internal Medicine

## 2015-06-07 ENCOUNTER — Other Ambulatory Visit: Payer: Federal, State, Local not specified - PPO

## 2015-06-07 ENCOUNTER — Other Ambulatory Visit: Payer: Self-pay | Admitting: *Deleted

## 2015-06-07 DIAGNOSIS — IMO0001 Reserved for inherently not codable concepts without codable children: Secondary | ICD-10-CM

## 2015-06-07 DIAGNOSIS — I82409 Acute embolism and thrombosis of unspecified deep veins of unspecified lower extremity: Secondary | ICD-10-CM

## 2015-06-07 DIAGNOSIS — Z5181 Encounter for therapeutic drug level monitoring: Secondary | ICD-10-CM

## 2015-06-07 DIAGNOSIS — I82509 Chronic embolism and thrombosis of unspecified deep veins of unspecified lower extremity: Secondary | ICD-10-CM

## 2015-06-07 DIAGNOSIS — Z7901 Long term (current) use of anticoagulants: Secondary | ICD-10-CM

## 2015-06-07 MED ORDER — WARFARIN SODIUM 4 MG PO TABS: 4.0000 mg | ORAL_TABLET | Freq: Every day | ORAL | Status: DC

## 2015-06-10 ENCOUNTER — Other Ambulatory Visit: Payer: Federal, State, Local not specified - PPO

## 2015-06-10 DIAGNOSIS — I82509 Chronic embolism and thrombosis of unspecified deep veins of unspecified lower extremity: Secondary | ICD-10-CM

## 2015-06-11 ENCOUNTER — Other Ambulatory Visit: Payer: Self-pay

## 2015-06-11 LAB — PROTIME-INR
INR: 3.4 — ABNORMAL HIGH (ref 0.8–1.2)
Prothrombin Time: 35.9 s — ABNORMAL HIGH (ref 9.1–12.0)

## 2015-06-11 MED ORDER — WARFARIN SODIUM 3 MG PO TABS: ORAL_TABLET | ORAL | Status: DC

## 2015-06-17 ENCOUNTER — Other Ambulatory Visit: Payer: Self-pay | Admitting: *Deleted

## 2015-06-17 ENCOUNTER — Other Ambulatory Visit: Payer: Federal, State, Local not specified - PPO

## 2015-06-17 DIAGNOSIS — D699 Hemorrhagic condition, unspecified: Secondary | ICD-10-CM

## 2015-06-18 LAB — PROTIME-INR
INR: 2.5 — ABNORMAL HIGH (ref 0.8–1.2)
Prothrombin Time: 26.5 s — ABNORMAL HIGH (ref 9.1–12.0)

## 2015-07-22 ENCOUNTER — Other Ambulatory Visit: Payer: Self-pay | Admitting: *Deleted

## 2015-07-22 MED ORDER — DULOXETINE HCL 60 MG PO CPEP: 60.0000 mg | ORAL_CAPSULE | Freq: Every day | ORAL | Status: DC

## 2015-07-22 NOTE — Telephone Encounter (Signed)
[facility]

## 2015-07-24 ENCOUNTER — Other Ambulatory Visit: Payer: Self-pay

## 2015-07-24 MED ORDER — DULOXETINE HCL 60 MG PO CPEP: 60.0000 mg | ORAL_CAPSULE | Freq: Every day | ORAL | Status: DC

## 2015-07-24 NOTE — Telephone Encounter (Signed)
Patient's daughter called indication mailorder supply is late and patient needs 2 pills to hold her over until Macy received. [facility] Patient [facility] only take [facility] name- noted on rx

## 2015-08-16 ENCOUNTER — Ambulatory Visit (INDEPENDENT_AMBULATORY_CARE_PROVIDER_SITE_OTHER): Payer: Federal, State, Local not specified - PPO

## 2015-08-16 VITALS — BP 100/72 | HR 106 | Temp 98.0°F | Resp 20

## 2015-08-16 DIAGNOSIS — Z7901 Long term (current) use of anticoagulants: Secondary | ICD-10-CM | POA: Diagnosis not present

## 2015-08-16 DIAGNOSIS — Z5181 Encounter for therapeutic drug level monitoring: Secondary | ICD-10-CM | POA: Diagnosis not present

## 2015-08-16 DIAGNOSIS — I82509 Chronic embolism and thrombosis of unspecified deep veins of unspecified lower extremity: Secondary | ICD-10-CM

## 2015-08-16 DIAGNOSIS — Z23 Encounter for immunization: Secondary | ICD-10-CM

## 2015-08-16 DIAGNOSIS — IMO0001 Reserved for inherently not codable concepts without codable children: Secondary | ICD-10-CM

## 2015-08-16 LAB — POCT INR: INR: 2

## 2015-08-19 DIAGNOSIS — Z23 Encounter for immunization: Secondary | ICD-10-CM

## 2015-08-19 NOTE — Addendum Note (Signed)
Addended byParticia Lather C on: 08/19/2015 09:40 AM   Modules accepted: Orders

## 2015-09-02 ENCOUNTER — Other Ambulatory Visit: Payer: Federal, State, Local not specified - PPO

## 2015-09-05 ENCOUNTER — Encounter: Payer: Self-pay | Admitting: Internal Medicine

## 2015-09-05 ENCOUNTER — Ambulatory Visit (INDEPENDENT_AMBULATORY_CARE_PROVIDER_SITE_OTHER): Payer: Federal, State, Local not specified - PPO | Admitting: Internal Medicine

## 2015-09-05 VITALS — BP 116/70 | HR 94 | Temp 98.2°F | Resp 14 | Ht 63.0 in | Wt 129.0 lb

## 2015-09-05 DIAGNOSIS — E785 Hyperlipidemia, unspecified: Secondary | ICD-10-CM

## 2015-09-05 DIAGNOSIS — F3341 Major depressive disorder, recurrent, in partial remission: Secondary | ICD-10-CM | POA: Diagnosis not present

## 2015-09-05 DIAGNOSIS — R739 Hyperglycemia, unspecified: Secondary | ICD-10-CM

## 2015-09-05 DIAGNOSIS — R630 Anorexia: Secondary | ICD-10-CM | POA: Diagnosis not present

## 2015-09-05 DIAGNOSIS — Z5181 Encounter for therapeutic drug level monitoring: Secondary | ICD-10-CM

## 2015-09-05 DIAGNOSIS — G309 Alzheimer's disease, unspecified: Secondary | ICD-10-CM | POA: Diagnosis not present

## 2015-09-05 DIAGNOSIS — Z7901 Long term (current) use of anticoagulants: Secondary | ICD-10-CM

## 2015-09-05 DIAGNOSIS — I82509 Chronic embolism and thrombosis of unspecified deep veins of unspecified lower extremity: Secondary | ICD-10-CM | POA: Diagnosis not present

## 2015-09-05 DIAGNOSIS — F0280 Dementia in other diseases classified elsewhere without behavioral disturbance: Secondary | ICD-10-CM

## 2015-09-05 NOTE — Progress Notes (Signed)
Patient ID: Faith Ortiz, female   DOB: 04-Jun-1932, 81 y.o.   MRN: [id number]   Location:  [facility] / [facility]  Code Status: DNR Goals of Care: Advanced Directive information Does patient have an advance directive?: No, Would patient like information on creating an advanced directive?: No - patient declined information   Chief Complaint  Patient presents with  . Medical Management of Chronic Issues    3 month follow-up on depression, appetite and weight     HPI: Patient is a 81 y.o. black female seen in the office today for med mgt of chronic diseases--primarily about her depression, decreased appetite and to f/u weight.  Says her mood is good.  Appetite is unchanged per pt, but increased a little per her daughter.  Weight about the same 129 today, 130 on July first.  She's also been eating a little more with her boost.  Having cucumbers and veggies in boost.  Walking daily each week now.  Going to bed at 9pm, getting up at 2, lounges around.  [place]  Last INR was on 9/16.     No falls.       Review of Systems:  Review of Systems  Constitutional: Negative for fever and chills.  HENT: Negative for congestion.   Eyes: Negative for blurred vision.       Glasses  Respiratory: Negative for shortness of breath.   Cardiovascular: Negative for chest pain.  Gastrointestinal: Negative for abdominal pain and constipation.  Genitourinary: Negative for dysuria, urgency and frequency.  Musculoskeletal: Negative for falls.  Skin: Negative for rash.  Neurological: Negative for dizziness and headaches.  Endo/Heme/Allergies: Does not bruise/bleed easily.  Psychiatric/Behavioral: Positive for memory loss. Negative for depression. The patient is not nervous/anxious and does not have insomnia.     Past Medical History  Diagnosis Date  . Cataract 2011-2013    left  . Alzheimer disease   . Mitral regurgitation   . Depression     . TIA (transient ischemic attack)     Past Surgical History  Procedure Laterality Date  . Abdominal hysterectomy    . Bladder repair    . Cesarean section    . Cesarean section    . Cesarean section    . Tonsillectomy    . Appendectomy      Allergies  Allergen Reactions  . Codeine   . Contrast Media [Iodinated Diagnostic Agents] Other (See Comments)    Weakness,   . Darvon [Propoxyphene] Other (See Comments)    Weakness, Fainting  . Wine [Alcohol]   . Penicillins Rash   Medications: Patient's Medications  New Prescriptions   No medications on file  Previous Medications   DULOXETINE (CYMBALTA) 60 MG CAPSULE    Take 1 capsule (60 mg total) by mouth daily. BRAND NAME ONLY   KLOR-CON 8 MEQ TABLET    TAKE 1 TABLET EVERY DAY   MEMANTINE (NAMENDA) 10 MG TABLET    Take one tablet by mouth twice daily for memory   SIMVASTATIN (ZOCOR) 10 MG TABLET    TAKE ONE TABLET BY MOUTH ONCE DAILY FOR CHOLESTEROL   TRAZODONE (DESYREL) 150 MG TABLET    Take 150 mg by mouth at bedtime.   WARFARIN (COUMADIN) 3 MG TABLET    Take 1 tablet Tuesday, Thursday, Saturday   WARFARIN (COUMADIN) 4 MG TABLET    Take 1 tablet (4 mg total) by mouth daily.  Modified Medications   No  medications on file  Discontinued Medications   ZOSTER VACCINE LIVE, PF, (ZOSTAVAX) 11914 UNT/0.65ML INJECTION    Inject 19,400 Units into the skin once.    Physical Exam: Filed Vitals:   09/05/15 1427  BP: 116/70  Pulse: 94  Temp: 98.2 F (36.8 C)  TempSrc: Oral  Resp: 14  Height:  (1.6 m)  Weight: 129 lb (58.514 kg)  SpO2: 97%   Physical Exam  Constitutional: She appears well-developed and well-nourished. No distress.  Eyes:  glasses  Cardiovascular: Normal rate, regular rhythm, normal heart sounds and intact distal pulses.   Pulmonary/Chest: Effort normal and breath sounds normal. No respiratory distress.  Abdominal: Soft. Bowel sounds are normal.  Musculoskeletal: Normal range of motion.  Neurological:  She is alert.  Skin: Skin is warm and dry.  Psychiatric: She has a normal mood and affect.    Labs reviewed: Basic Metabolic Panel:  Recent Labs  78/29/56 1303  NA 140  K 3.6  CL 101  CO2 21  GLUCOSE 125*  BUN 12  CREATININE 0.76  CALCIUM 9.2  TSH 2.310   Liver Function Tests:  Recent Labs  03/12/15 1303  AST 23  ALT 12  ALKPHOS 61  BILITOT <0.2  PROT 6.7   No results for input(s): LIPASE, AMYLASE in the last 8760 hours. No results for input(s): AMMONIA in the last 8760 hours. CBC:  Recent Labs  11/28/14 1101 03/12/15 1303  WBC 6.5 3.3*  NEUTROABS 4.0 1.3*  HGB 9.8* 10.1*  HCT 30.1* 34.2  MCV 87 72*  PLT  --  358   Lipid Panel:  Recent Labs  03/12/15 1303  CHOL 202*  HDL 66  LDLCALC 108*  TRIG 139  CHOLHDL 3.1   No results found for: HGBA1C  Assessment/Plan 1. Alzheimer disease -seems stable -cont on namenda and monitor -her daughter supervises her  2. Chronic deep vein thrombosis of lower extremity, unspecified laterality - cont coumadin therapy -needs to keep routine INRs at least monthly - CBC with Differential/Platelet - Protime-INR  3. Anticoagulation goal of INR 2 to 3 - on coumadin therapy - last INR was over a month ago so recheck today by lab draw - CBC with Differential/Platelet - Protime-INR  4. Major depressive disorder, recurrent episode, in partial remission (HCC) -cont cymbalta and trazodone, seems she is doing a little better with this, still sleeping a lot but eating better  5. Decreased appetite -cont boost and vegetables and fruit she is taking in - Comprehensive metabolic panel  6. Hyperlipidemia -cont zocor therapy  7. Hyperglycemia - check hba1c - Comprehensive metabolic panel - Hemoglobin A1c  Labs/tests ordered:   Orders Placed This Encounter  Procedures  . CBC with Differential/Platelet  . Comprehensive metabolic panel  . Hemoglobin A1c  . Protime-INR    Next appt:  3 mos, but will need INR  at least in 1 month if not sooner based on today's result  Leanah Kolander L. Leeum Sankey, D.O. Geriatrics [facility] [address], [place] [address] Cell Phone (Mon-Fri 8am-5pm):  [phone] On Call:  [phone] & follow prompts after 5pm & weekends Office Phone:  [phone] Office Fax:  [phone]

## 2015-09-05 NOTE — Patient Instructions (Signed)
We will call you with your blood thinness results and your other labs.  Keep on walking and eating more!  You are doing well!

## 2015-09-06 LAB — COMPREHENSIVE METABOLIC PANEL
ALT: 12 IU/L (ref 0–32)
AST: 19 IU/L (ref 0–40)
Albumin/Globulin Ratio: 1.4 (ref 1.1–2.5)
Albumin: 3.9 g/dL (ref 3.5–4.7)
Alkaline Phosphatase: 56 IU/L (ref 39–117)
BUN/Creatinine Ratio: 20 (ref 11–26)
BUN: 17 mg/dL (ref 8–27)
Bilirubin Total: 0.3 mg/dL (ref 0.0–1.2)
CO2: 21 mmol/L (ref 18–29)
Calcium: 9.6 mg/dL (ref 8.7–10.3)
Chloride: 98 mmol/L (ref 97–108)
Creatinine, Ser: 0.86 mg/dL (ref 0.57–1.00)
GFR calc Af Amer: 73 mL/min/{1.73_m2} (ref >59)
GFR calc non Af Amer: 64 mL/min/{1.73_m2} (ref >59)
Globulin, Total: 2.8 g/dL (ref 1.5–4.5)
Glucose: 202 mg/dL — ABNORMAL HIGH (ref 65–99)
Potassium: 3.5 mmol/L (ref 3.5–5.2)
Sodium: 138 mmol/L (ref 134–144)
Total Protein: 6.7 g/dL (ref 6.0–8.5)

## 2015-09-06 LAB — CBC WITH DIFFERENTIAL/PLATELET
Basophils Absolute: 0 10*3/uL (ref 0.0–0.2)
Basos: 0 %
EOS (ABSOLUTE): 0 10*3/uL (ref 0.0–0.4)
Eos: 1 %
Hematocrit: 39.2 % (ref 34.0–46.6)
Hemoglobin: 12.5 g/dL (ref 11.1–15.9)
Immature Grans (Abs): 0 10*3/uL (ref 0.0–0.1)
Immature Granulocytes: 0 %
Lymphocytes Absolute: 1.7 10*3/uL (ref 0.7–3.1)
Lymphs: 42 %
MCH: 27.1 pg (ref 26.6–33.0)
MCHC: 31.9 g/dL (ref 31.5–35.7)
MCV: 85 fL (ref 79–97)
Monocytes Absolute: 0.2 10*3/uL (ref 0.1–0.9)
Monocytes: 4 %
Neutrophils Absolute: 2.1 10*3/uL (ref 1.4–7.0)
Neutrophils: 53 %
Platelets: 267 10*3/uL (ref 150–379)
RBC: 4.62 x10E6/uL (ref 3.77–5.28)
RDW: 15.9 % — ABNORMAL HIGH (ref 12.3–15.4)
WBC: 4 10*3/uL (ref 3.4–10.8)

## 2015-09-06 LAB — PROTIME-INR
INR: 2.7 — ABNORMAL HIGH (ref 0.8–1.2)
Prothrombin Time: 27.4 s — ABNORMAL HIGH (ref 9.1–12.0)

## 2015-09-06 LAB — HEMOGLOBIN A1C
Est. average glucose Bld gHb Est-mCnc: 128 mg/dL
Hgb A1c MFr Bld: 6.1 % — ABNORMAL HIGH (ref 4.8–5.6)

## 2015-09-11 ENCOUNTER — Other Ambulatory Visit: Payer: Self-pay

## 2015-09-11 DIAGNOSIS — Z5181 Encounter for therapeutic drug level monitoring: Secondary | ICD-10-CM

## 2015-09-11 DIAGNOSIS — Z7901 Long term (current) use of anticoagulants: Secondary | ICD-10-CM

## 2015-09-11 DIAGNOSIS — I82509 Chronic embolism and thrombosis of unspecified deep veins of unspecified lower extremity: Secondary | ICD-10-CM

## 2015-09-11 MED ORDER — MEMANTINE HCL 10 MG PO TABS: ORAL_TABLET | ORAL | Status: DC

## 2015-09-11 NOTE — Telephone Encounter (Signed)
Patient is awaiting mail order rx and requested for 6 pills to [facility]. RX sent patient requested Non-Generic

## 2015-09-23 ENCOUNTER — Other Ambulatory Visit: Payer: Self-pay | Admitting: Internal Medicine

## 2015-10-14 ENCOUNTER — Other Ambulatory Visit: Payer: Federal, State, Local not specified - PPO

## 2015-10-14 DIAGNOSIS — Z7901 Long term (current) use of anticoagulants: Secondary | ICD-10-CM

## 2015-10-14 DIAGNOSIS — I82509 Chronic embolism and thrombosis of unspecified deep veins of unspecified lower extremity: Secondary | ICD-10-CM

## 2015-10-14 DIAGNOSIS — Z5181 Encounter for therapeutic drug level monitoring: Secondary | ICD-10-CM

## 2015-10-15 LAB — PROTIME-INR
INR: 2.7 — ABNORMAL HIGH (ref 0.8–1.2)
Prothrombin Time: 27.1 s — ABNORMAL HIGH (ref 9.1–12.0)

## 2015-11-13 ENCOUNTER — Other Ambulatory Visit: Payer: Self-pay | Admitting: *Deleted

## 2015-11-13 MED ORDER — SIMVASTATIN 10 MG PO TABS: ORAL_TABLET | ORAL | Status: DC

## 2015-11-19 ENCOUNTER — Ambulatory Visit (INDEPENDENT_AMBULATORY_CARE_PROVIDER_SITE_OTHER): Payer: Federal, State, Local not specified - PPO | Admitting: Internal Medicine

## 2015-11-19 VITALS — BP 110/70 | HR 105 | Temp 98.2°F | Resp 20 | Ht 63.0 in | Wt 127.6 lb

## 2015-11-19 DIAGNOSIS — K1120 Sialoadenitis, unspecified: Secondary | ICD-10-CM

## 2015-11-19 MED ORDER — NAPROXEN SODIUM 220 MG PO TABS: ORAL_TABLET | ORAL | Status: DC

## 2015-11-19 MED ORDER — CLINDAMYCIN HCL 300 MG PO CAPS: ORAL_CAPSULE | ORAL | Status: DC

## 2015-11-19 NOTE — Progress Notes (Signed)
Patient ID: Faith Ortiz, female   DOB: 1932/06/27, 81 y.o.   MRN: [id number]    Facility      Place of Service:       Allergies  Allergen Reactions  . Codeine   . Contrast Media [Iodinated Diagnostic Agents] Other (See Comments)    Weakness,   . Darvon [Propoxyphene] Other (See Comments)    Weakness, Fainting  . Wine [Alcohol]   . Penicillins Rash    Chief Complaint  Patient presents with  . Acute Visit    gland(lt) is swollen x 2 days, pain    HPI:  Submandibular gland inflammation - acutely inflamed, enlarged and painful left submandibular gland for the last 2 days. Aleve helped. No fever. swallowing without difficulty    Medications: Patient's Medications  New Prescriptions   No medications on file  Previous Medications   CHOLECALCIFEROL (VITAMIN D-3) 1000 UNITS CAPS    Take 1 capsule by mouth daily.   COUMADIN 3 MG TABLET    TAKE 1 TABLET TUESDAY, THURSDAY, SATURDAY   DULOXETINE (CYMBALTA) 60 MG CAPSULE    Take 1 capsule (60 mg total) by mouth daily. BRAND NAME ONLY   KLOR-CON 8 MEQ TABLET    TAKE 1 TABLET EVERY DAY   MEMANTINE (NAMENDA) 10 MG TABLET    Non-Generic Take one tablet by mouth twice daily for memory   SIMVASTATIN (ZOCOR) 10 MG TABLET    TAKE ONE TABLET BY MOUTH ONCE DAILY FOR CHOLESTEROL   TRAZODONE (DESYREL) 150 MG TABLET    Take 150 mg by mouth at bedtime.   WARFARIN (COUMADIN) 4 MG TABLET    Take 1 tablet (4 mg total) by mouth daily.  Modified Medications   No medications on file  Discontinued Medications   No medications on file     Review of Systems  Constitutional: Negative for fever and chills.  HENT: Negative for congestion.        Acutely tender left submandibular gland about 2-3 times normal size. Mild gingivitis, but no other intraoral problems.  Eyes:       Glasses  Respiratory: Negative for shortness of breath.   Cardiovascular: Negative for chest pain.  Gastrointestinal: Negative for abdominal pain and constipation.    Genitourinary: Negative for dysuria, urgency and frequency.  Skin: Negative for rash.  Neurological: Negative for dizziness and headaches.  Hematological: Does not bruise/bleed easily.  Psychiatric/Behavioral: The patient is not nervous/anxious.     Filed Vitals:   11/19/15 1609  BP: 110/70  Pulse: 105  Temp: 98.2 F (36.8 C)  TempSrc: Oral  Resp: 20  Height: 5' 3"  (1.6 m)  Weight: 127 lb 9.6 oz (57.879 kg)  SpO2: 97%   Body mass index is 22.61 kg/(m^2).  Physical Exam  Constitutional: She appears well-developed and well-nourished. No distress.  Eyes:  glasses  Neck: No JVD present. No tracheal deviation present. No thyromegaly present.  Acutely inflamed, enlarged, and tender left submandibular gland.  Cardiovascular: Normal rate, regular rhythm, normal heart sounds and intact distal pulses.   Pulmonary/Chest: Effort normal and breath sounds normal. No respiratory distress.  Abdominal: Soft. Bowel sounds are normal. She exhibits no distension. There is no tenderness.  Musculoskeletal: Normal range of motion.  Neurological: She is alert.  Memory loss  Skin: Skin is warm and dry.  Psychiatric: She has a normal mood and affect.     Labs reviewed: Lab Summary Latest Ref Rng 09/05/2015 03/12/2015  Hemoglobin 11.1 - 15.9 g/dL 12.5 10.1(L)  Hematocrit  34.0 - 46.6 % 39.2 34.2  White count 3.4 - 10.8 x10E3/uL 4.0 3.3(L)  Platelet count 150 - 379 x10E3/uL 267 358  Sodium 134 - 144 mmol/L 138 140  Potassium 3.5 - 5.2 mmol/L 3.5 3.6  Calcium 8.7 - 10.3 mg/dL 9.6 9.2  Phosphorus - (None) (None)  Creatinine 0.57 - 1.00 mg/dL 0.86 0.76  AST 0 - 40 IU/L 19 23  Alk Phos 39 - 117 IU/L 56 61  Bilirubin 0.0 - 1.2 mg/dL 0.3 <0.2  Glucose 65 - 99 mg/dL 202(H) 125(H)  Cholesterol - (None) (None)  HDL cholesterol >39 mg/dL (None) 66  Triglycerides 0 - 149 mg/dL (None) 139  LDL Direct - (None) (None)  LDL Calc 0 - 99 mg/dL (None) 108(H)  Total protein - (None) (None)  Albumin 3.5 -  4.7 g/dL 3.9 4.0   Lab Results  Component Value Date   TSH 2.310 03/12/2015   Lab Results  Component Value Date   BUN 17 09/05/2015   Lab Results  Component Value Date   HGBA1C 6.1* 09/05/2015       Assessment/Plan  1. Submandibular gland inflammation -Aleve 2-3 times daily as neede - clindamycin (CLEOCIN) 300 MG capsule; One 3 times daily for infection  Dispense: 21 capsule; Refill: 0

## 2015-11-26 ENCOUNTER — Ambulatory Visit: Payer: Self-pay | Admitting: Internal Medicine

## 2015-12-06 ENCOUNTER — Ambulatory Visit: Payer: Federal, State, Local not specified - PPO | Admitting: Internal Medicine

## 2015-12-12 ENCOUNTER — Ambulatory Visit (INDEPENDENT_AMBULATORY_CARE_PROVIDER_SITE_OTHER): Payer: Federal, State, Local not specified - PPO | Admitting: Internal Medicine

## 2015-12-12 ENCOUNTER — Encounter: Payer: Self-pay | Admitting: Internal Medicine

## 2015-12-12 VITALS — BP 114/72 | HR 80 | Temp 98.1°F | Ht 63.0 in | Wt 127.0 lb

## 2015-12-12 DIAGNOSIS — G309 Alzheimer's disease, unspecified: Secondary | ICD-10-CM | POA: Diagnosis not present

## 2015-12-12 DIAGNOSIS — K1120 Sialoadenitis, unspecified: Secondary | ICD-10-CM

## 2015-12-12 DIAGNOSIS — R630 Anorexia: Secondary | ICD-10-CM

## 2015-12-12 DIAGNOSIS — Z5181 Encounter for therapeutic drug level monitoring: Secondary | ICD-10-CM

## 2015-12-12 DIAGNOSIS — E785 Hyperlipidemia, unspecified: Secondary | ICD-10-CM | POA: Diagnosis not present

## 2015-12-12 DIAGNOSIS — F0280 Dementia in other diseases classified elsewhere without behavioral disturbance: Secondary | ICD-10-CM

## 2015-12-12 DIAGNOSIS — F3341 Major depressive disorder, recurrent, in partial remission: Secondary | ICD-10-CM | POA: Diagnosis not present

## 2015-12-12 DIAGNOSIS — I82509 Chronic embolism and thrombosis of unspecified deep veins of unspecified lower extremity: Secondary | ICD-10-CM | POA: Diagnosis not present

## 2015-12-12 DIAGNOSIS — R739 Hyperglycemia, unspecified: Secondary | ICD-10-CM | POA: Diagnosis not present

## 2015-12-12 DIAGNOSIS — Z7901 Long term (current) use of anticoagulants: Secondary | ICD-10-CM | POA: Diagnosis not present

## 2015-12-12 DIAGNOSIS — G4700 Insomnia, unspecified: Secondary | ICD-10-CM

## 2015-12-12 LAB — CBC AND DIFFERENTIAL
HEMATOCRIT: 40 % (ref 36–46)
HEMOGLOBIN: 13.6 g/dL (ref 12.0–16.0)
Platelets: 253 10*3/uL (ref 150–399)
WBC: 3.5 10*3/mL

## 2015-12-12 LAB — BASIC METABOLIC PANEL
BUN: 15 mg/dL (ref 4–21)
CREATININE: 0.9 mg/dL (ref 0.5–1.1)
GLUCOSE: 104 mg/dL
POTASSIUM: 4 mmol/L (ref 3.4–5.3)
Sodium: 141 mmol/L (ref 137–147)

## 2015-12-12 LAB — LIPID PANEL
Cholesterol: 217 mg/dL — AB (ref 0–200)
HDL: 68 mg/dL (ref 35–70)
LDL Cholesterol: 122 mg/dL
Triglycerides: 137 mg/dL (ref 40–160)

## 2015-12-12 LAB — HEMOGLOBIN A1C: HEMOGLOBIN A1C: 6

## 2015-12-12 LAB — HEPATIC FUNCTION PANEL
ALK PHOS: 58 U/L (ref 25–125)
ALT: 15 U/L (ref 7–35)
AST: 26 U/L (ref 13–35)
Bilirubin, Total: 0.4 mg/dL

## 2015-12-12 NOTE — Progress Notes (Signed)
Patient ID: Faith Ortiz, female   DOB: Oct 28, 1932, 82 y.o.   MRN: [id number]   Location: [facility] Provider: Gwenith Spitz. Renato Gails, D.O., C.M.D.  Goals of Care: Advanced Directive information Does patient have an advance directive?: No  Chief Complaint  Patient presents with  . Medical Management of Chronic Issues    medical management, Alzheimer, depression  . submandibular gland    still a little swollen, finished Clindamycin    HPI: Patient is a 81 y.o. female seen in the office today for med mgt of chronic diseases and f/u of submandibular gland swelling.    She saw Dr. Chilton Si on 12/20 and was treated with clindamycin 3x per day for a week and aleve 2-3x per day.   Still has slight swelling on left side.     She is on chronic coumadin.  No bleeding or new bruising. No falls.    Weight unchanged.  Still doing boost with veggies and 1/2 banana every other day and regular boost 2 per day.  Also will eat lobster bisque and chicken wings here and there.    Still has difficulty with short term memory.  When her son in law leaves to go to the gym, she'll forget where he went.    She said she'd try going to a senior center once a week.    Sleeping well now.  Continues on trazodone.  Review of Systems:  Review of Systems  Constitutional: Negative for weight loss and malaise/fatigue.  HENT: Negative for sore throat.        Submandibular gland swelling improved  Eyes: Negative for blurred vision.  Respiratory: Negative for shortness of breath.   Cardiovascular: Negative for chest pain.  Gastrointestinal: Negative for abdominal pain.  Genitourinary: Negative for dysuria.  Musculoskeletal: Negative for falls.  Neurological: Negative for weakness.  Endo/Heme/Allergies: Bruises/bleeds easily.  Psychiatric/Behavioral: Positive for memory loss. The patient does not have insomnia.     Past Medical History  Diagnosis Date  . Cataract 2011-2013    left  . Alzheimer disease     . Mitral regurgitation   . Depression   . TIA (transient ischemic attack)     Past Surgical History  Procedure Laterality Date  . Abdominal hysterectomy    . Bladder repair    . Cesarean section    . Cesarean section    . Cesarean section    . Tonsillectomy    . Appendectomy      Allergies  Allergen Reactions  . Codeine   . Contrast Media [Iodinated Diagnostic Agents] Other (See Comments)    Weakness,   . Darvon [Propoxyphene] Other (See Comments)    Weakness, Fainting  . Wine [Alcohol]   . Penicillins Rash      Medication List       This list is accurate as of: 12/12/15  9:12 AM.  Always use your most recent med list.               clindamycin 300 MG capsule  Commonly known as:  CLEOCIN  One 3 times daily for infection     DULoxetine 60 MG capsule  Commonly known as:  CYMBALTA  Take 1 capsule (60 mg total) by mouth daily. BRAND NAME ONLY     KLOR-CON 8 MEQ tablet  Generic drug:  potassium chloride  TAKE 1 TABLET EVERY DAY     memantine 10 MG tablet  Commonly known as:  NAMENDA  Non-Generic Take one tablet by mouth twice daily  for memory     naproxen sodium 220 MG tablet  Commonly known as:  ALEVE  One up to three times daily to help pain at gland     simvastatin 10 MG tablet  Commonly known as:  ZOCOR  TAKE ONE TABLET BY MOUTH ONCE DAILY FOR CHOLESTEROL     traZODone 150 MG tablet  Commonly known as:  DESYREL  Take 150 mg by mouth at bedtime.     Vitamin D-3 1000 units Caps  Take 1 capsule by mouth daily.     warfarin 4 MG tablet  Commonly known as:  COUMADIN  Take 1 tablet (4 mg total) by mouth daily.     COUMADIN 3 MG tablet  Generic drug:  warfarin  TAKE 1 TABLET TUESDAY, THURSDAY, SATURDAY        Health Maintenance  Topic Date Due  . INFLUENZA VACCINE  06/30/2016  . TETANUS/TDAP  08/09/2019  . DEXA SCAN  Completed  . ZOSTAVAX  Completed  . PNA vac Low Risk Adult  Completed    Physical Exam: Filed Vitals:   12/12/15 0900   BP: 114/72  Pulse: 80  Temp: 98.1 F (36.7 C)  TempSrc: Oral  Height: 5\' 3"  (1.6 m)  Weight: 127 lb (57.607 kg)  SpO2: 95%   Body mass index is 22.5 kg/(m^2). Physical Exam  Constitutional: She appears well-developed and well-nourished. No distress.  Cardiovascular: Normal rate, regular rhythm and normal heart sounds.   Pulmonary/Chest: Effort normal and breath sounds normal. No respiratory distress.  Abdominal: Soft. Bowel sounds are normal. She exhibits no distension. There is no tenderness.  Musculoskeletal: Normal range of motion.  Lymphadenopathy:  Barely palpable swelling of submandibular gland remains  Neurological: She is alert.  Oriented to person and place, not time  Skin: Skin is warm and dry.    Labs reviewed: Basic Metabolic Panel:  Recent Labs  13/24/40 1303 09/05/15 1503  NA 140 138  K 3.6 3.5  CL 101 98  CO2 21 21  GLUCOSE 125* 202*  BUN 12 17  CREATININE 0.76 0.86  CALCIUM 9.2 9.6  TSH 2.310  --    Liver Function Tests:  Recent Labs  03/12/15 1303 09/05/15 1503  AST 23 19  ALT 12 12  ALKPHOS 61 56  BILITOT <0.2 0.3  PROT 6.7 6.7  ALBUMIN 4.0 3.9   No results for input(s): LIPASE, AMYLASE in the last 8760 hours. No results for input(s): AMMONIA in the last 8760 hours. CBC:  Recent Labs  03/12/15 1303 09/05/15 1503  WBC 3.3* 4.0  NEUTROABS 1.3* 2.1  HGB 10.1*  --   HCT 34.2 39.2  MCV 72* 85  PLT 358 267   Lipid Panel:  Recent Labs  03/12/15 1303  CHOL 202*  HDL 66  LDLCALC 108*  TRIG 139  CHOLHDL 3.1   Lab Results  Component Value Date   HGBA1C 6.1* 09/05/2015    Assessment/Plan 1. Submandibular gland inflammation -nearly resolved after completing clindamycin -her daughter requested more abx, but pt otherwise feels well and is afebrile -I am concerned she could develop c diff if she has another course -cont to monitor  2. Alzheimer disease -stable, her daughter did not note any new symptoms or  progression  3. Chronic deep vein thrombosis of lower extremity, unspecified laterality -continues on coumadin--needs monthly INRs but has not been consistent with this -I'd like for her to get on Cathey's schedule for maintenance if possible  4. Anticoagulation goal of INR  2 to 3 -f/u INR needed--she is to go get all labs at [facility] site as we don't have phlebotomist today  5. Major depressive disorder, recurrent episode, in partial remission (HCC) -doing fairly well with current cymbalta therapy  6. Hyperlipidemia -maintained on zocor 10mg  so continue and f/u labs  7. Hyperglycemia -monitor hba1c, diet very limited so cannot really restrict her much here (drinks smoothies, shakes primarily due to dental issues and chronic poor appetite)  8. Insomnia -cont trazodone with benefit -?remeron  9. Decreased appetite -cont supplemental shakes and smoothies  Labs/tests ordered: lab orders entered for [facility] site Next appt:  12/14/2015 INR check only; 3 mos med mgt   Helen Winterhalter L. Kylena Mole, D.O. Geriatrics [facility] [address] [place] [place], [place] [address] Cell Phone (Mon-Fri 8am-5pm):  [phone] On Call:  [phone] & follow prompts after 5pm & weekends Office Phone:  [phone] Office Fax:  [phone]

## 2015-12-13 ENCOUNTER — Encounter: Payer: Self-pay | Admitting: *Deleted

## 2015-12-14 ENCOUNTER — Other Ambulatory Visit: Payer: Self-pay | Admitting: Internal Medicine

## 2015-12-16 ENCOUNTER — Telehealth: Payer: Self-pay | Admitting: *Deleted

## 2015-12-16 NOTE — Telephone Encounter (Signed)
Spoke with daughter regarding lab draw, she stated that they went to [facility] just to get the PT/INR drawn. Daughter is not happy but will bring her Mother on Wednesday to get the PT-INR drawn

## 2015-12-17 ENCOUNTER — Telehealth: Payer: Self-pay | Admitting: *Deleted

## 2015-12-17 NOTE — Telephone Encounter (Signed)
Call daughter regarding lab results from [facility] , she stated that she understood the results but should not have to pay for them to draw her PT/INR because they missed this lab. I informed patient's daughter that I would talk to my office manager regarding this situation.

## 2015-12-18 ENCOUNTER — Other Ambulatory Visit: Payer: Federal, State, Local not specified - PPO

## 2015-12-18 ENCOUNTER — Other Ambulatory Visit: Payer: Self-pay | Admitting: *Deleted

## 2015-12-18 DIAGNOSIS — Z7901 Long term (current) use of anticoagulants: Principal | ICD-10-CM

## 2015-12-18 DIAGNOSIS — Z5181 Encounter for therapeutic drug level monitoring: Secondary | ICD-10-CM

## 2015-12-18 MED ORDER — MEMANTINE HCL 10 MG PO TABS: ORAL_TABLET | ORAL | Status: DC

## 2015-12-18 NOTE — Telephone Encounter (Signed)
Patient requested refill for #10 while waiting for mail order to get here. Wants faxed to [facility]

## 2015-12-19 LAB — PROTIME-INR
INR: 2.5 — ABNORMAL HIGH (ref 0.8–1.2)
Prothrombin Time: 25.6 s — ABNORMAL HIGH (ref 9.1–12.0)

## 2015-12-22 ENCOUNTER — Other Ambulatory Visit: Payer: Self-pay | Admitting: Internal Medicine

## 2016-01-02 ENCOUNTER — Encounter: Payer: Self-pay | Admitting: Internal Medicine

## 2016-01-23 ENCOUNTER — Telehealth: Payer: Self-pay

## 2016-01-23 NOTE — Telephone Encounter (Signed)
Patient's daughter aware letter is ready for pick-up

## 2016-01-23 NOTE — Telephone Encounter (Signed)
Ok, except the 2 years part--leave that out and make sure the age is current.  Thanks.

## 2016-01-23 NOTE — Telephone Encounter (Signed)
Message left on triage voicemail: Please call, need letter today.  I called patient/patient's daughter and patient's daughter needs a note stating that she is her mothers caregiver. Note from previous doctor went as follows:   To whom it may concern, this note is relative to my patient Faith Ortiz who is 82 years old and is seriously ill.  Patient has been under my care for 2 years. Patient's daughter [name] (daughter) has been the caregiver of [name] and it's advised that she continues as her caregiver to avoid an adverse impact on [name]'s health.  Dr.Reed please advise if you would like for me to compose a note/letter with the exact wording as above, if yes I will compose, print for signature, and call patient to pick letter up today. Thanks

## 2016-01-27 ENCOUNTER — Ambulatory Visit (INDEPENDENT_AMBULATORY_CARE_PROVIDER_SITE_OTHER): Payer: Federal, State, Local not specified - PPO | Admitting: Pharmacotherapy

## 2016-01-27 ENCOUNTER — Encounter: Payer: Self-pay | Admitting: Pharmacotherapy

## 2016-01-27 VITALS — BP 110/70 | HR 100 | Temp 98.4°F | Resp 20 | Ht 63.0 in | Wt 128.6 lb

## 2016-01-27 DIAGNOSIS — I82509 Chronic embolism and thrombosis of unspecified deep veins of unspecified lower extremity: Secondary | ICD-10-CM | POA: Diagnosis not present

## 2016-01-27 DIAGNOSIS — Z7901 Long term (current) use of anticoagulants: Secondary | ICD-10-CM | POA: Diagnosis not present

## 2016-01-27 DIAGNOSIS — Z5181 Encounter for therapeutic drug level monitoring: Secondary | ICD-10-CM

## 2016-01-27 DIAGNOSIS — IMO0001 Reserved for inherently not codable concepts without codable children: Secondary | ICD-10-CM

## 2016-01-27 LAB — POCT INR: INR: 2.4

## 2016-01-27 NOTE — Patient Instructions (Signed)
INR 2.4 Continue Coumadin  on Sun, Mon, Wed, Fr And Coumadin  on Tues, Thurs, Sat

## 2016-01-27 NOTE — Progress Notes (Signed)
   Subjective:    Patient ID: Faith Ortiz, female    DOB: 1931-12-27, 82 y.o.   MRN: [id number]  HPI Current Coumadin dose is  QD except  T/Th/Sat Denies missed doses. Denies unusual bleeding or bruising. Denies CP, SOB, falls Consistent with vitamin K intake   Review of Systems  HENT: Negative for nosebleeds.   Respiratory: Negative for shortness of breath.   Cardiovascular: Negative for chest pain.  Gastrointestinal: Negative for blood in stool and anal bleeding.  Genitourinary: Negative for hematuria.  Hematological: Does not bruise/bleed easily.       Objective:   Physical Exam  Constitutional: She is oriented to person, place, and time. She appears well-developed and well-nourished.  HENT:  Right Ear: External ear normal.  Left Ear: External ear normal.  Cardiovascular: Normal rate, regular rhythm and normal heart sounds.   Pulmonary/Chest: Effort normal and breath sounds normal.  Neurological: She is alert and oriented to person, place, and time.  Skin: Skin is warm and dry.  Psychiatric: She has a normal mood and affect. Her behavior is normal. Judgment and thought content normal.  Vitals reviewed.    BP:  110/70  HR: 100,  Wt: 128lb INR 2.4     Assessment & Plan:  1.  INR at goal 2-3 2.  Continue Coumadin  QD except  T/Th/Sat 3.  INR 1 month

## 2016-02-24 ENCOUNTER — Ambulatory Visit: Payer: Federal, State, Local not specified - PPO | Admitting: Pharmacotherapy

## 2016-03-02 ENCOUNTER — Other Ambulatory Visit: Payer: Self-pay | Admitting: Internal Medicine

## 2016-03-02 ENCOUNTER — Emergency Department (HOSPITAL_COMMUNITY): Payer: Federal, State, Local not specified - PPO

## 2016-03-02 ENCOUNTER — Emergency Department (HOSPITAL_COMMUNITY)
Admission: EM | Admit: 2016-03-02 | Discharge: 2016-03-03 | Disposition: A | Discharge status: Home / Self Care | Payer: Federal, State, Local not specified - PPO | Attending: Emergency Medicine | Admitting: Emergency Medicine

## 2016-03-02 ENCOUNTER — Encounter (HOSPITAL_COMMUNITY): Payer: Self-pay

## 2016-03-02 DIAGNOSIS — Z880 Allergy status to penicillin: Secondary | ICD-10-CM | POA: Diagnosis not present

## 2016-03-02 DIAGNOSIS — R1110 Vomiting, unspecified: Secondary | ICD-10-CM | POA: Insufficient documentation

## 2016-03-02 DIAGNOSIS — F0280 Dementia in other diseases classified elsewhere without behavioral disturbance: Secondary | ICD-10-CM | POA: Insufficient documentation

## 2016-03-02 DIAGNOSIS — G309 Alzheimer's disease, unspecified: Secondary | ICD-10-CM | POA: Insufficient documentation

## 2016-03-02 DIAGNOSIS — Z8673 Personal history of transient ischemic attack (TIA), and cerebral infarction without residual deficits: Secondary | ICD-10-CM | POA: Diagnosis not present

## 2016-03-02 DIAGNOSIS — H269 Unspecified cataract: Secondary | ICD-10-CM | POA: Insufficient documentation

## 2016-03-02 DIAGNOSIS — Z7901 Long term (current) use of anticoagulants: Secondary | ICD-10-CM | POA: Insufficient documentation

## 2016-03-02 DIAGNOSIS — Z87891 Personal history of nicotine dependence: Secondary | ICD-10-CM | POA: Diagnosis not present

## 2016-03-02 DIAGNOSIS — Z8679 Personal history of other diseases of the circulatory system: Secondary | ICD-10-CM | POA: Diagnosis not present

## 2016-03-02 DIAGNOSIS — Z79899 Other long term (current) drug therapy: Secondary | ICD-10-CM | POA: Diagnosis not present

## 2016-03-02 DIAGNOSIS — H5500 Unspecified nystagmus: Secondary | ICD-10-CM | POA: Insufficient documentation

## 2016-03-02 DIAGNOSIS — R42 Dizziness and giddiness: Secondary | ICD-10-CM | POA: Insufficient documentation

## 2016-03-02 DIAGNOSIS — F329 Major depressive disorder, single episode, unspecified: Secondary | ICD-10-CM | POA: Insufficient documentation

## 2016-03-02 LAB — COMPREHENSIVE METABOLIC PANEL
ALBUMIN: 3.6 g/dL (ref 3.5–5.0)
ALK PHOS: 56 U/L (ref 38–126)
ALT: 19 U/L (ref 14–54)
AST: 28 U/L (ref 15–41)
Anion gap: 10 (ref 5–15)
BILIRUBIN TOTAL: 0.4 mg/dL (ref 0.3–1.2)
BUN: 16 mg/dL (ref 6–20)
CALCIUM: 9.9 mg/dL (ref 8.9–10.3)
CO2: 28 mmol/L (ref 22–32)
Chloride: 103 mmol/L (ref 101–111)
Creatinine, Ser: 0.86 mg/dL (ref 0.44–1.00)
GFR calc non Af Amer: >60 mL/min (ref >60)
GLUCOSE: 113 mg/dL — AB (ref 65–99)
Potassium: 3.9 mmol/L (ref 3.5–5.1)
SODIUM: 141 mmol/L (ref 135–145)
TOTAL PROTEIN: 7.2 g/dL (ref 6.5–8.1)

## 2016-03-02 LAB — CBC WITH DIFFERENTIAL/PLATELET
Basophils Absolute: 0 10*3/uL (ref 0.0–0.1)
Basophils Relative: 0 %
EOS ABS: 0 10*3/uL (ref 0.0–0.7)
Eosinophils Relative: 1 %
HEMATOCRIT: 39.5 % (ref 36.0–46.0)
HEMOGLOBIN: 13.1 g/dL (ref 12.0–15.0)
LYMPHS ABS: 1.8 10*3/uL (ref 0.7–4.0)
Lymphocytes Relative: 41 %
MCH: 28.8 pg (ref 26.0–34.0)
MCHC: 33.2 g/dL (ref 30.0–36.0)
MCV: 86.8 fL (ref 78.0–100.0)
MONOS PCT: 5 %
Monocytes Absolute: 0.2 10*3/uL (ref 0.1–1.0)
NEUTROS ABS: 2.3 10*3/uL (ref 1.7–7.7)
NEUTROS PCT: 53 %
Platelets: 193 10*3/uL (ref 150–400)
RBC: 4.55 MIL/uL (ref 3.87–5.11)
RDW: 13.5 % (ref 11.5–15.5)
WBC: 4.4 10*3/uL (ref 4.0–10.5)

## 2016-03-02 LAB — PROTIME-INR
INR: 2.65 — ABNORMAL HIGH (ref 0.00–1.49)
Prothrombin Time: 27.9 seconds — ABNORMAL HIGH (ref 11.6–15.2)

## 2016-03-02 LAB — I-STAT TROPONIN, ED: Troponin i, poc: 0 ng/mL (ref 0.00–0.08)

## 2016-03-02 NOTE — ED Notes (Signed)
Pt. Coming from home via [facility] for episode of dizziness with emesis today. Pt. Went to sit up in bed today and became very dizziness and threw up. Pt. Diaphoretic when EMS arrived, but that resolved PTA. Pt. Throwing PVC per EMS en route. Pt. Has no complaints at this time. Pt hx of DVT and TIA 20 years ago. Pt. Vitals WDL en route per EMS.

## 2016-03-02 NOTE — ED Notes (Signed)
EDP at bedside  

## 2016-03-02 NOTE — ED Provider Notes (Signed)
CSN: [id number]     Arrival date & time 03/02/16  2117 History   First MD Initiated Contact with Patient 03/02/16 2119     Chief Complaint  Patient presents with  . Dizziness     (Consider location/radiation/quality/duration/timing/severity/associated sxs/prior Treatment) The history is provided by the patient.  Faith Ortiz is a 82 y.o. female hx of dementia, TIA on coumadin, Mitral regurg here with dizziness, vomiting. She was drinking and sure today for dinner and then vomited. She states that she felt dizzy and the room was spinning at that time but now feels fine. Patient denies any chest pain or shortness of breath or abdominal pain. Has a history of TIA but never had dizziness before. Denies trouble speaking or weakness.     Past Medical History  Diagnosis Date  . Cataract 2011-2013    left  . Alzheimer disease   . Mitral regurgitation   . Depression   . TIA (transient ischemic attack)    Past Surgical History  Procedure Laterality Date  . Abdominal hysterectomy    . Bladder repair    . Cesarean section    . Cesarean section    . Cesarean section    . Tonsillectomy    . Appendectomy     Family History  Problem Relation Age of Onset  . Diabetes Father   . Arthritis Mother   . Breast cancer Sister   . Breast cancer Sister   . Stroke Sister   . Diabetes Brother   . Lung disease Brother   . Sarcoidosis Son 58  . Arthritis Son 41  . Gout Son 60  . Diabetes Daughter 2   Social History  Substance Use Topics  . Smoking status: Former Smoker -- 0.00 packs/day for 5 years    Types: Cigarettes  . Smokeless tobacco: Never Used     Comment: Quit about age 65   . Alcohol Use: No   OB History    No data available     Review of Systems  Neurological: Positive for dizziness.  All other systems reviewed and are negative.     Allergies  Codeine; Contrast media; Darvon; Wine; and Penicillins  Home Medications   Prior to Admission medications   Medication  Sig Start Date End Date Taking? Authorizing Provider  Calcium Citrate-Vitamin D (CALCIUM + D PO) Take 1 tablet by mouth every morning.   Yes Historical Provider, MD  COUMADIN 3 MG tablet TAKE 1 TABLET TUESDAY, THURSDAY, SATURDAY 09/23/15  Yes Tiffany L Reed, DO  COUMADIN 4 MG tablet TAKE 1 TABLET (4 MG TOTAL) BY MOUTH DAILY. 12/16/15  Yes Tiffany L Reed, DO  DULoxetine (CYMBALTA) 60 MG capsule Take 1 capsule (60 mg total) by mouth daily. BRAND NAME ONLY 07/24/15  Yes Tiffany L Reed, DO  KLOR-CON 8 MEQ tablet TAKE 1 TABLET EVERY DAY 12/23/15  Yes Tiffany L Reed, DO  NAMENDA 10 MG tablet TAKE 1 TABLET TWICE A DAY  FOR MEMORY 03/02/16  Yes Tiffany L Reed, DO  simvastatin (ZOCOR) 10 MG tablet TAKE ONE TABLET BY MOUTH ONCE DAILY FOR CHOLESTEROL 11/13/15  Yes Tiffany L Reed, DO  traZODone (DESYREL) 150 MG tablet Take 150 mg by mouth at bedtime.   Yes Historical Provider, MD   BP 132/68 mmHg  Pulse 71  Temp(Src) 97.8 F (36.6 C) (Oral)  Resp 20  Ht  (1.626 m)  Wt 128 lb (58.06 kg)  BMI 21.96 kg/m2  SpO2 100% Physical Exam  Constitutional: She  is oriented to person, place, and time. She appears well-nourished.  Well appearing for age   HENT:  Head: Normocephalic.  Mouth/Throat: Oropharynx is clear and moist.  Eyes: Pupils are equal, round, and reactive to light.  Mild rightward nystagmus, no rotatory nystagmus   Neck: Normal range of motion. Neck supple.  Cardiovascular: Normal rate, regular rhythm and normal heart sounds.   Pulmonary/Chest: Effort normal and breath sounds normal. No respiratory distress. She has no wheezes. She has no rales.  Abdominal: Soft. Bowel sounds are normal. She exhibits no distension. There is no tenderness. There is no rebound.  Musculoskeletal: Normal range of motion. She exhibits no edema or tenderness.  Neurological: She is alert and oriented to person, place, and time.  CN 2-12 intact. Nl finger to nose. Nl gait. Nl strength throughout   Skin: Skin is warm  and dry.  Psychiatric: She has a normal mood and affect. Her behavior is normal. Judgment and thought content normal.  Nursing note and vitals reviewed.   ED Course  Procedures (including critical care time) Labs Review Labs Reviewed  COMPREHENSIVE METABOLIC PANEL - Abnormal; Notable for the following:    Glucose, Bld 113 (*)    All other components within normal limits  PROTIME-INR - Abnormal; Notable for the following:    Prothrombin Time 27.9 (*)    INR 2.65 (*)    All other components within normal limits  CBC WITH DIFFERENTIAL/PLATELET  Rosezena SensorI-STAT TROPOININ, ED    Imaging Review Mr Brain Wo Contrast  03/03/2016  CLINICAL DATA:  Initial evaluation for acute dizziness, emesis. EXAM: MRI HEAD WITHOUT CONTRAST TECHNIQUE: Multiplanar, multiecho pulse sequences of the brain and surrounding structures were obtained without intravenous contrast. COMPARISON:  None. FINDINGS: Diffuse prominence of the CSF containing spaces compatible with moderate cerebral atrophy. Patchy T2/FLAIR hyperintensity within the periventricular and deep white matter both cerebral hemispheres, nonspecific, but most like related to very minimal chronic small vessel ischemic type changes. Small vessel ischemic changes within the pons. No abnormal foci of restricted diffusion to suggest acute infarct. Gray-white matter differentiation maintained. Major intracranial vascular flow voids are preserved. No acute or chronic intracranial hemorrhage. No areas of chronic infarction. No mass lesion, midline shift, or mass effect. No hydrocephalus. Coaptation of the anterior horn of the right lateral ventricle noted. Basilar cisterns are patent. Major dural sinuses are grossly patent. No extra-axial fluid collection. Craniocervical junction within normal limits. Scattered multilevel degenerative spondylolysis within the visualized upper cervical spine without significant stenosis. Incidental note made of a partially empty sella. No acute  abnormality about the orbits. Sequela prior bilateral lens extraction noted. Scattered mucosal thickening within the paranasal sinuses. No air-fluid levels to suggest active sinus infection. No mastoid effusion. Inner ear structures grossly normal. Bone marrow signal intensity within normal limits. No scalp soft tissue abnormality. IMPRESSION: 1. No acute intracranial infarct or other process identified. 2. Moderate cerebral atrophy with mild chronic small vessel ischemic disease. Electronically Signed   By: Rise MuBenjamin  McClintock M.D.   On: 03/03/2016 00:11   I have personally reviewed and evaluated these images and lab results as part of my medical decision-making.   EKG Interpretation   Date/Time:  Monday March 02 2016 21:21:52 EDT Ventricular Rate:  73 PR Interval:  138 QRS Duration: 71 QT Interval:  403 QTC Calculation: 444 R Axis:   33 Text Interpretation:  Sinus rhythm Multiple ventricular premature  complexes Borderline T wave abnormalities No previous ECGs available some  PVCs Confirmed by Silverio LayYAO  MD, Othar Curto ([id number]) on 03/03/2016 12:20:18 AM      MDM   Final diagnoses:  Dizziness    Faith Ortiz is a 83 y.o. female here with vertigo. Has mild R nystagmus but doesn't feel dizzy currently. She has nl neuro exam. Given hx of TIA on coumadin, consider ischemic stroke vs bleed. Will get MRI brain, labs.   12:22 AM MRI brain unremarkable. Labs unremarkable. Had some PVCs on EKG but has no palpitations or chest pain. Will dc home with meclizine as needed.     Richardean Canal, MD 03/03/16 [phone]

## 2016-03-03 MED ORDER — MECLIZINE HCL 12.5 MG PO TABS: 12.5000 mg | ORAL_TABLET | Freq: Three times a day (TID) | ORAL | Status: DC | PRN

## 2016-03-03 NOTE — Discharge Instructions (Signed)
Take meclizine as needed for dizziness.   Continue your current meds.   See your doctor   Return to Er if you have worse dizziness, vertigo, room spinning, trouble walking, weakness, numbness

## 2016-03-04 ENCOUNTER — Other Ambulatory Visit: Payer: Federal, State, Local not specified - PPO

## 2016-03-04 ENCOUNTER — Telehealth: Payer: Self-pay | Admitting: *Deleted

## 2016-03-04 ENCOUNTER — Telehealth: Payer: Self-pay

## 2016-03-04 DIAGNOSIS — I82509 Chronic embolism and thrombosis of unspecified deep veins of unspecified lower extremity: Secondary | ICD-10-CM

## 2016-03-04 NOTE — Telephone Encounter (Signed)
Spoke with patients daughter about her appointment for Monday with [name] and that just getting the blood work was not all that was required and that she would still need to see Lynden AngCathy . She told me it was to expensive to keep coming to these appointments, and if she could come in and just get blood work done with out the appointment that's what she will do.

## 2016-03-04 NOTE — Telephone Encounter (Signed)
Patient daughter called and left message and stated that she wants her mother's Protime drawn today and not Monday. I called her back and left message on her voicemail and informed her that the appointment on Monday was to see [name] not just for labwork and I wasn't sure if she was aware of this. Instructed her to return call.

## 2016-03-04 NOTE — Telephone Encounter (Signed)
error 

## 2016-03-04 NOTE — Telephone Encounter (Signed)
Faith Ortiz came to me at lunch and told me that daughter called her and stated that she was coming in today for Protime that she could not bring her mother in on Monday. Stated that she could either come today or tomorrow. Daughter stated that she did know that Monday was for an appointment but unable to bring her at that time. I told Faith Ortiz to make sure daughter was aware that she would need to reschedule to [facility]. Protime order placed.

## 2016-03-05 LAB — PROTIME-INR
INR: 3.5 — ABNORMAL HIGH (ref 0.8–1.2)
Prothrombin Time: 35.1 s — ABNORMAL HIGH (ref 9.1–12.0)

## 2016-03-09 ENCOUNTER — Ambulatory Visit: Payer: Federal, State, Local not specified - PPO | Admitting: Pharmacotherapy

## 2016-03-20 ENCOUNTER — Other Ambulatory Visit: Payer: Self-pay

## 2016-03-25 ENCOUNTER — Other Ambulatory Visit: Payer: Federal, State, Local not specified - PPO

## 2016-03-25 ENCOUNTER — Other Ambulatory Visit: Payer: Self-pay

## 2016-03-25 DIAGNOSIS — Z7901 Long term (current) use of anticoagulants: Secondary | ICD-10-CM

## 2016-03-25 DIAGNOSIS — Z5181 Encounter for therapeutic drug level monitoring: Secondary | ICD-10-CM

## 2016-03-25 DIAGNOSIS — I82509 Chronic embolism and thrombosis of unspecified deep veins of unspecified lower extremity: Principal | ICD-10-CM

## 2016-03-25 DIAGNOSIS — IMO0001 Reserved for inherently not codable concepts without codable children: Secondary | ICD-10-CM

## 2016-03-27 ENCOUNTER — Other Ambulatory Visit: Payer: Federal, State, Local not specified - PPO

## 2016-03-27 DIAGNOSIS — I82509 Chronic embolism and thrombosis of unspecified deep veins of unspecified lower extremity: Principal | ICD-10-CM

## 2016-03-27 DIAGNOSIS — Z5181 Encounter for therapeutic drug level monitoring: Secondary | ICD-10-CM

## 2016-03-27 DIAGNOSIS — IMO0001 Reserved for inherently not codable concepts without codable children: Secondary | ICD-10-CM

## 2016-03-27 DIAGNOSIS — Z7901 Long term (current) use of anticoagulants: Secondary | ICD-10-CM

## 2016-03-28 LAB — PROTIME-INR
INR: 3 — ABNORMAL HIGH (ref 0.8–1.2)
Prothrombin Time: 30.4 s — ABNORMAL HIGH (ref 9.1–12.0)

## 2016-04-20 ENCOUNTER — Telehealth: Payer: Self-pay | Admitting: *Deleted

## 2016-04-20 ENCOUNTER — Other Ambulatory Visit: Payer: Federal, State, Local not specified - PPO

## 2016-04-20 DIAGNOSIS — I82509 Chronic embolism and thrombosis of unspecified deep veins of unspecified lower extremity: Principal | ICD-10-CM

## 2016-04-20 DIAGNOSIS — IMO0001 Reserved for inherently not codable concepts without codable children: Secondary | ICD-10-CM

## 2016-04-20 DIAGNOSIS — Z5181 Encounter for therapeutic drug level monitoring: Secondary | ICD-10-CM

## 2016-04-20 NOTE — Telephone Encounter (Signed)
Pt's daughter is requesting that all PT/INR results be faxed to [place]. Harlow [place]ancy [place] [id number] ( daughter will call back with fax number)

## 2016-04-21 LAB — PROTIME-INR
INR: 3.1 — ABNORMAL HIGH (ref 0.8–1.2)
Prothrombin Time: 31.7 s — ABNORMAL HIGH (ref 9.1–12.0)

## 2016-05-11 ENCOUNTER — Other Ambulatory Visit: Payer: Self-pay | Admitting: Internal Medicine

## 2016-05-11 ENCOUNTER — Other Ambulatory Visit: Payer: Self-pay | Admitting: *Deleted

## 2016-05-11 MED ORDER — COUMADIN 3 MG PO TABS: ORAL_TABLET | ORAL | Status: DC

## 2016-05-11 NOTE — Telephone Encounter (Signed)
Received fax from [facility] stating there was a system error on their end to refax Coumadin Rx to [facility]. Refaxed.

## 2016-05-20 ENCOUNTER — Other Ambulatory Visit: Payer: Self-pay

## 2016-05-22 ENCOUNTER — Other Ambulatory Visit: Payer: Self-pay

## 2016-06-01 ENCOUNTER — Other Ambulatory Visit: Payer: Self-pay | Admitting: Internal Medicine

## 2016-06-14 IMAGING — MR MR HEAD W/O CM
9 of 10 series · 35 of 48 positions shown · non-contrast
Comparison: None.

CLINICAL DATA: Initial evaluation for acute dizziness, emesis.

EXAM:
MRI HEAD WITHOUT CONTRAST
TECHNIQUE: Multiplanar, multiecho pulse sequences of the brain and surrounding
structures were obtained without intravenous contrast.

[Series 4: T1 · sagittal · 5.0mm · 0.47mm/px · 3 of 23 slices shown]
[im 1/23]
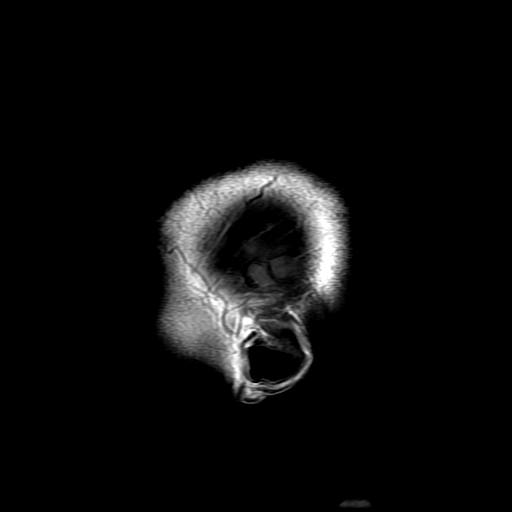
[im 12/23]
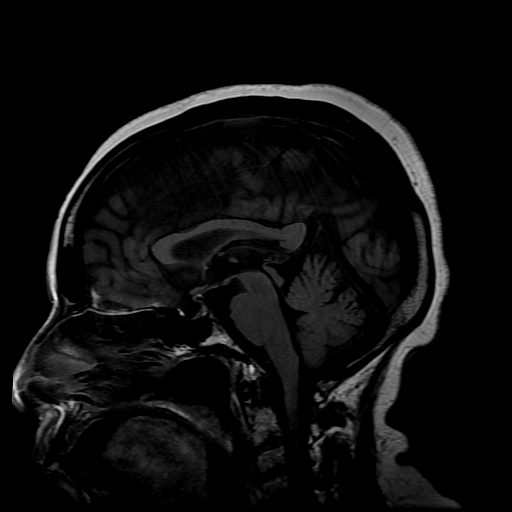
[im 23/23]
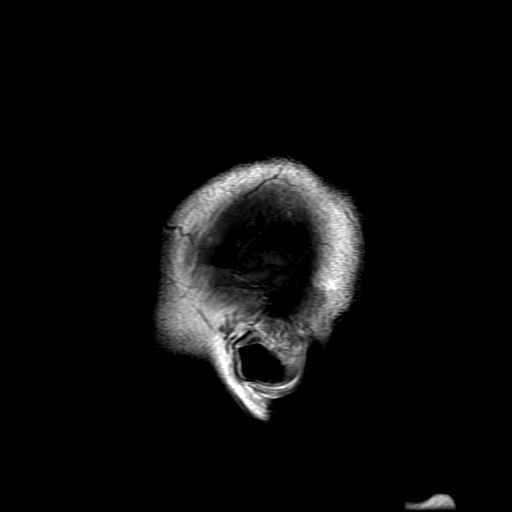

[Series 5: DWI · axial · 3.0mm · 1.09mm/px · z∈[-112,+19]mm · 8 of 90 slices shown (1 of 4)]
[im 1/90]
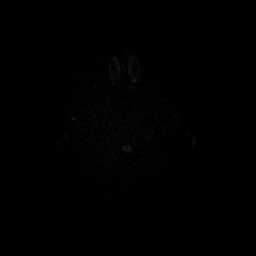
[im 10/90]
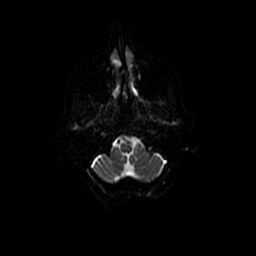
[im 30/90]
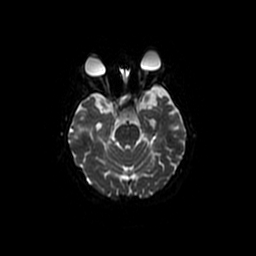
[im 40/90]
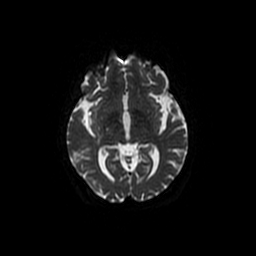
[im 50/90]
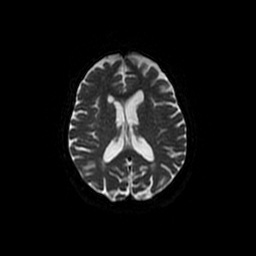
[im 60/90]
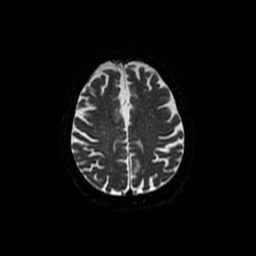
[im 80/90]
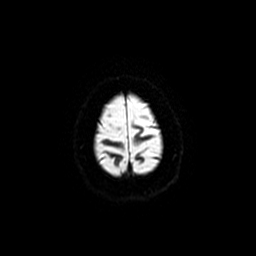
[im 90/90]
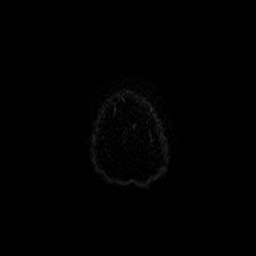

[Series 6: T2 · axial · 5.0mm · 0.43mm/px · z∈[-107,+24]mm · 2 of 23 slices shown (1 of 2)]
[im 1/23]
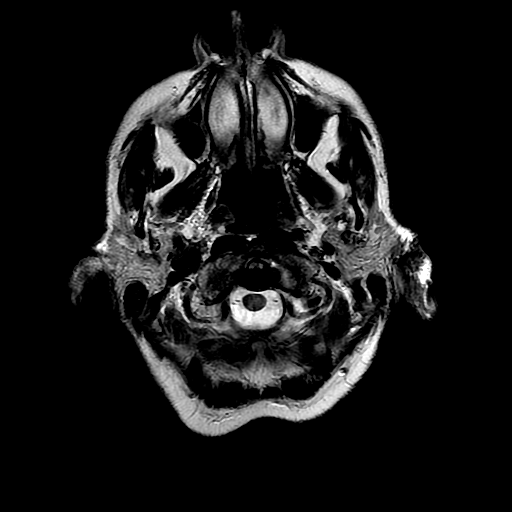
[im 23/23]
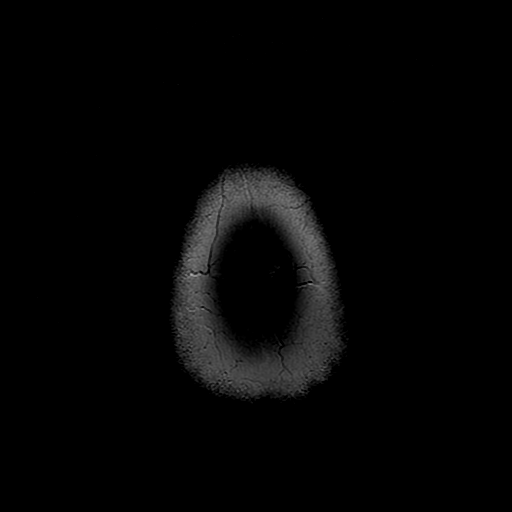

[Series 7: DWI · coronal · 5.0mm · 1.09mm/px · 7 of 66 slices shown (2 of 4)]
[im 1/66]
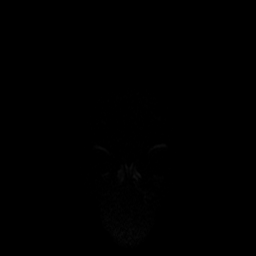
[im 11/66]
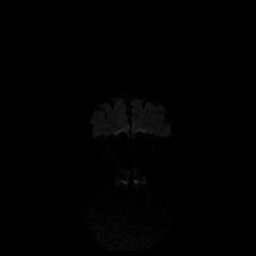
[im 22/66]
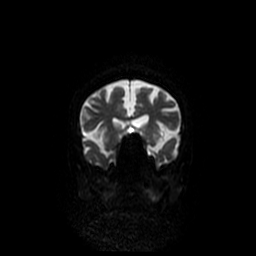
[im 33/66]
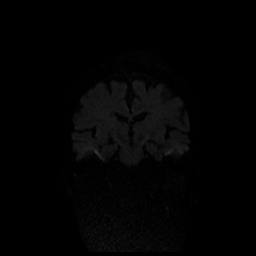
[im 44/66]
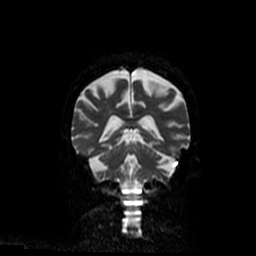
[im 55/66]
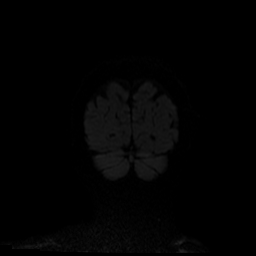
[im 66/66]
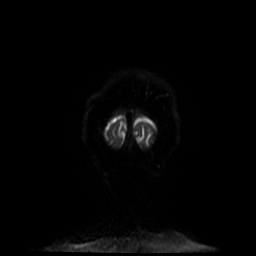

[Series 8: FLAIR · axial · 5.0mm · 0.43mm/px · z∈[-107,+24]mm · 2 of 23 slices shown]
[im 1/23]
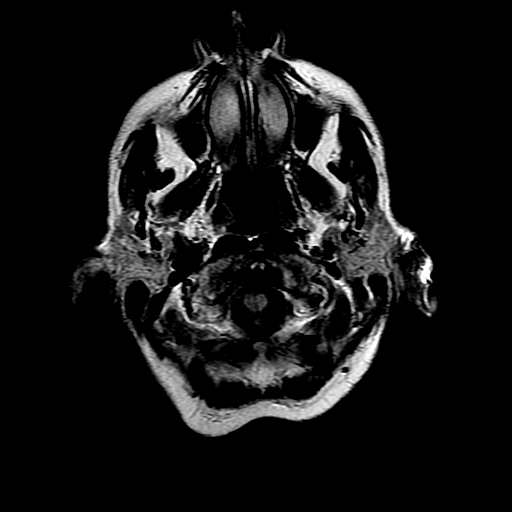
[im 23/23]
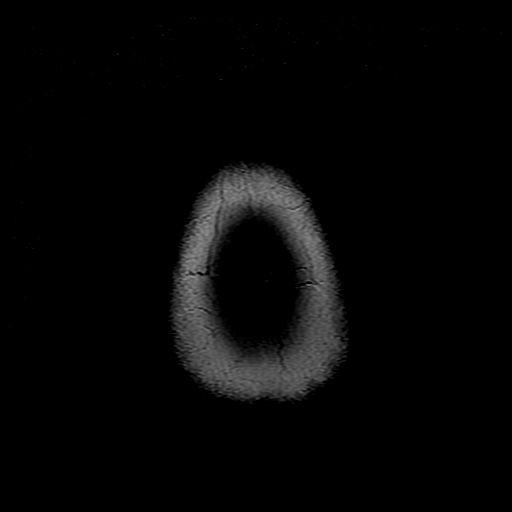

[Series 9: ax mpgr · axial · 5.0mm · 0.43mm/px · 1 of 23 slices shown]
[im 1/23]
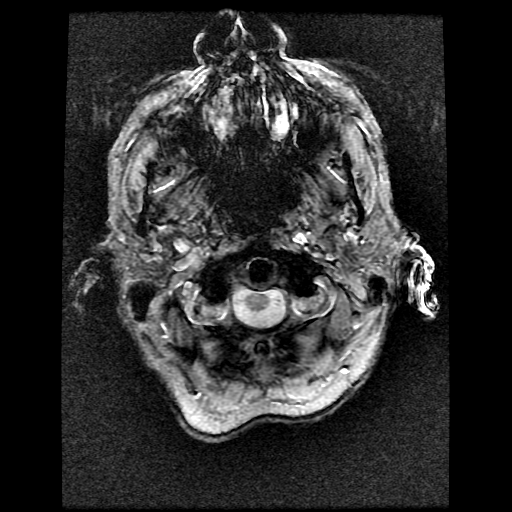

[Series 12: T2 · coronal · 5.0mm · 0.43mm/px · 3 of 28 slices shown (2 of 2)]
[im 1/28]
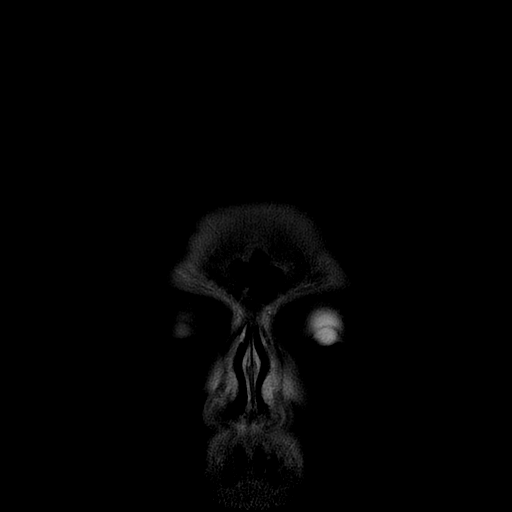
[im 14/28]
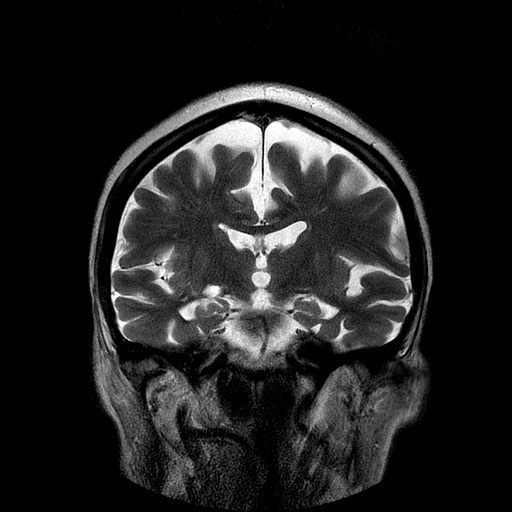
[im 28/28]
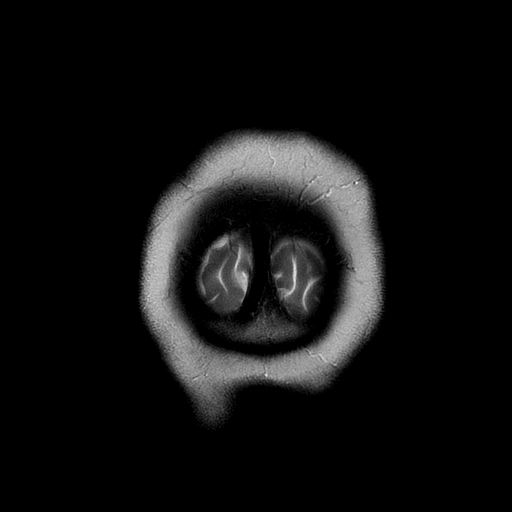

[Series 500: DWI · axial · 3.0mm · 1.09mm/px · z∈[-112,+19]mm · 5 of 45 slices shown (3 of 4)]
[im 1/45]
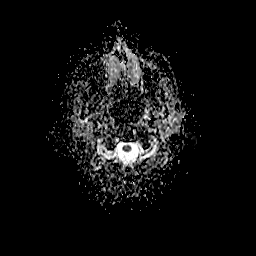
[im 12/45]
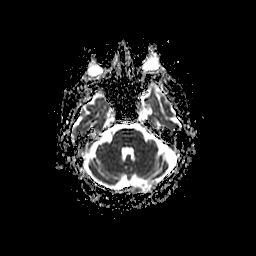
[im 23/45]
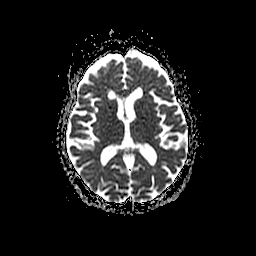
[im 34/45]
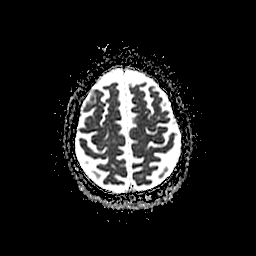
[im 45/45]
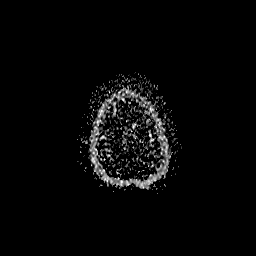

[Series 700: DWI · coronal · 5.0mm · 1.09mm/px · 4 of 33 slices shown (4 of 4)]
[im 1/33]
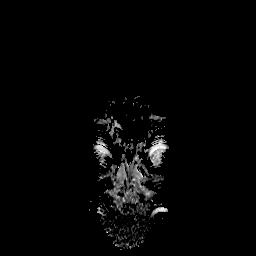
[im 11/33]
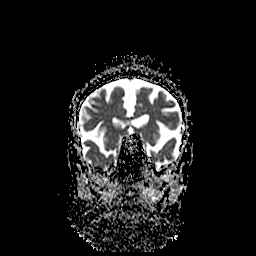
[im 22/33]
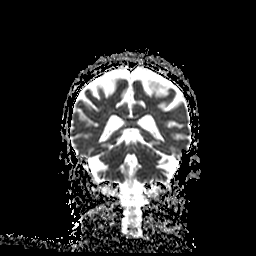
[im 33/33]
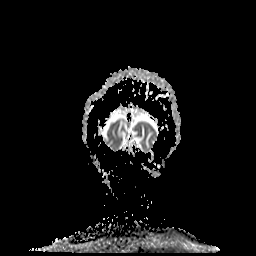

[35 of 48 positions shown; findings below may reference images not displayed]

FINDINGS: Diffuse prominence of the CSF containing spaces compatible with
moderate cerebral atrophy. Patchy T2/FLAIR hyperintensity within the
periventricular and deep white matter both cerebral hemispheres,
nonspecific, but most like related to very minimal chronic small
vessel ischemic type changes. Small vessel ischemic changes within
the pons.

No abnormal foci of restricted diffusion to suggest acute infarct.
Gray-white matter differentiation maintained. Major intracranial
vascular flow voids are preserved. No acute or chronic intracranial
hemorrhage. No areas of chronic infarction.

No mass lesion, midline shift, or mass effect. No hydrocephalus.
Coaptation of the anterior horn of the right lateral ventricle
noted. Basilar cisterns are patent. Major dural sinuses are grossly
patent. No extra-axial fluid collection.

Craniocervical junction within normal limits. Scattered multilevel
degenerative spondylolysis within the visualized upper cervical
spine without significant stenosis.

Incidental note made of a partially empty sella. No acute
abnormality about the orbits. Sequela prior bilateral lens
extraction noted.

Scattered mucosal thickening within the paranasal sinuses. No
air-fluid levels to suggest active sinus infection. No mastoid
effusion. Inner ear structures grossly normal.

Bone marrow signal intensity within normal limits. No scalp soft
tissue abnormality.
IMPRESSION: 1. No acute intracranial infarct or other process identified.
2. Moderate cerebral atrophy with mild chronic small vessel ischemic
disease.

## 2016-06-17 ENCOUNTER — Other Ambulatory Visit: Payer: Self-pay | Admitting: Internal Medicine

## 2016-06-19 ENCOUNTER — Other Ambulatory Visit: Payer: Federal, State, Local not specified - PPO

## 2016-06-19 DIAGNOSIS — IMO0001 Reserved for inherently not codable concepts without codable children: Secondary | ICD-10-CM

## 2016-06-19 DIAGNOSIS — I82509 Chronic embolism and thrombosis of unspecified deep veins of unspecified lower extremity: Principal | ICD-10-CM

## 2016-06-19 DIAGNOSIS — Z7901 Long term (current) use of anticoagulants: Secondary | ICD-10-CM

## 2016-06-20 LAB — PROTIME-INR
INR: 1.9 — ABNORMAL HIGH
Prothrombin Time: 20 s — ABNORMAL HIGH (ref 9.0–11.5)

## 2016-07-14 ENCOUNTER — Other Ambulatory Visit: Payer: Self-pay | Admitting: Internal Medicine

## 2016-07-15 ENCOUNTER — Other Ambulatory Visit: Payer: Self-pay

## 2016-07-15 MED ORDER — DULOXETINE HCL 60 MG PO CPEP: 60.0000 mg | ORAL_CAPSULE | Freq: Every day | ORAL | 2 refills | Status: DC

## 2016-07-27 ENCOUNTER — Ambulatory Visit: Payer: Federal, State, Local not specified - PPO | Admitting: Internal Medicine

## 2016-07-27 ENCOUNTER — Other Ambulatory Visit: Payer: Federal, State, Local not specified - PPO

## 2016-07-27 DIAGNOSIS — IMO0001 Reserved for inherently not codable concepts without codable children: Secondary | ICD-10-CM

## 2016-07-27 DIAGNOSIS — Z5181 Encounter for therapeutic drug level monitoring: Secondary | ICD-10-CM

## 2016-07-27 DIAGNOSIS — I82509 Chronic embolism and thrombosis of unspecified deep veins of unspecified lower extremity: Principal | ICD-10-CM

## 2016-07-27 LAB — PROTIME-INR
INR: 1.6 — ABNORMAL HIGH
Prothrombin Time: 17.1 s — ABNORMAL HIGH (ref 9.0–11.5)

## 2016-08-05 ENCOUNTER — Other Ambulatory Visit: Payer: Federal, State, Local not specified - PPO

## 2016-08-05 DIAGNOSIS — Z5181 Encounter for therapeutic drug level monitoring: Secondary | ICD-10-CM

## 2016-08-05 DIAGNOSIS — I82509 Chronic embolism and thrombosis of unspecified deep veins of unspecified lower extremity: Principal | ICD-10-CM

## 2016-08-05 DIAGNOSIS — IMO0001 Reserved for inherently not codable concepts without codable children: Secondary | ICD-10-CM

## 2016-08-06 LAB — PROTIME-INR
INR: 2.4 — ABNORMAL HIGH
Prothrombin Time: 24.7 s — ABNORMAL HIGH (ref 9.0–11.5)

## 2016-08-10 ENCOUNTER — Ambulatory Visit: Payer: Federal, State, Local not specified - PPO | Admitting: Internal Medicine

## 2016-08-20 ENCOUNTER — Other Ambulatory Visit: Payer: Self-pay

## 2016-08-24 ENCOUNTER — Encounter: Payer: Self-pay | Admitting: Internal Medicine

## 2016-08-24 ENCOUNTER — Ambulatory Visit (INDEPENDENT_AMBULATORY_CARE_PROVIDER_SITE_OTHER): Payer: Federal, State, Local not specified - PPO | Admitting: Internal Medicine

## 2016-08-24 VITALS — BP 112/70 | HR 75 | Temp 98.3°F | Wt 129.0 lb

## 2016-08-24 DIAGNOSIS — E785 Hyperlipidemia, unspecified: Secondary | ICD-10-CM

## 2016-08-24 DIAGNOSIS — Z7901 Long term (current) use of anticoagulants: Secondary | ICD-10-CM

## 2016-08-24 DIAGNOSIS — Z23 Encounter for immunization: Secondary | ICD-10-CM

## 2016-08-24 DIAGNOSIS — G4700 Insomnia, unspecified: Secondary | ICD-10-CM

## 2016-08-24 DIAGNOSIS — G309 Alzheimer's disease, unspecified: Secondary | ICD-10-CM | POA: Diagnosis not present

## 2016-08-24 DIAGNOSIS — I82509 Chronic embolism and thrombosis of unspecified deep veins of unspecified lower extremity: Secondary | ICD-10-CM

## 2016-08-24 DIAGNOSIS — Z5181 Encounter for therapeutic drug level monitoring: Secondary | ICD-10-CM

## 2016-08-24 DIAGNOSIS — R630 Anorexia: Secondary | ICD-10-CM

## 2016-08-24 DIAGNOSIS — R739 Hyperglycemia, unspecified: Secondary | ICD-10-CM

## 2016-08-24 DIAGNOSIS — F0280 Dementia in other diseases classified elsewhere without behavioral disturbance: Secondary | ICD-10-CM

## 2016-08-24 LAB — POCT INR: INR: 2.7

## 2016-08-24 NOTE — Patient Instructions (Signed)
Stop using the clippers on your hand--this is dangerous.  Use the coconut oil to moisturize it.    Continue with your boost supplements.  You got your flu shot today.  Your INR today was 2.7.  Continue the same dose of coumadin and recheck at next appt for annual visit in October.

## 2016-08-24 NOTE — Progress Notes (Signed)
Location:  [facility] Provider:  Rolla Kedzierski L. Renato Gailseed, D.O., C.M.D.  Code Status: DNR Goals of Care:  Advanced Directives 03/02/2016  Does patient have an advance directive? Yes  Type of Advance Directive Healthcare Power of Attorney  Copy of advanced directive(s) in chart? -  Would patient like information on creating an advanced directive? -   Chief Complaint  Patient presents with  . Medical Management of Chronic Issues    follow-up    HPI: Patient is a 82 y.o. female seen today for medical management of chronic diseases.    Pt here with her daughter.  She is picking at her hand with clippers.  Explained risks.    Boost remains her primary intake.  Has been eating cabbage lately.  2 boosts in am with pills, sleeps until 1-2pm.  Doesn't do much.  Weight up 1 lbs since last visit.  Does laps around the house 6 days per week.  [facility]  Sleeps well at night with trazodone.    [place]    No pain.  No falls.    Accidentally threw out her partial.  Needs to get a new one.  Past Medical History:  Diagnosis Date  . Alzheimer disease   . Cataract 2011-2013   left  . Depression   . Mitral regurgitation   . TIA (transient ischemic attack)     Past Surgical History:  Procedure Laterality Date  . ABDOMINAL HYSTERECTOMY    . APPENDECTOMY    . BLADDER REPAIR    . CESAREAN SECTION    . CESAREAN SECTION    . CESAREAN SECTION    . TONSILLECTOMY      Allergies  Allergen Reactions  . Codeine Other (See Comments)    unknown  . Contrast Media [Iodinated Diagnostic Agents] Other (See Comments)    Weakness,   . Darvon [Propoxyphene] Other (See Comments)    Weakness, Fainting  . Wine [Alcohol] Other (See Comments)    Dizzy   . Penicillins Rash      Medication List       Accurate as of 08/24/16  3:54 PM. Always use your most recent med list.          CALCIUM + D PO Take 1 tablet by mouth every morning.   COUMADIN 4 MG  tablet Generic drug:  warfarin TAKE 1 TABLET (4 MG TOTAL) BY MOUTH DAILY.   COUMADIN 3 MG tablet Generic drug:  warfarin Take one tablet by mouth Tuesday, Thursday and Saturday   DULoxetine 60 MG capsule Commonly known as:  CYMBALTA Take 1 capsule (60 mg total) by mouth daily.   KLOR-CON 8 MEQ tablet Generic drug:  potassium chloride TAKE 1 TABLET EVERY DAY   meclizine 12.5 MG tablet Commonly known as:  ANTIVERT Take 1 tablet (12.5 mg total) by mouth 3 (three) times daily as needed for dizziness.   NAMENDA 10 MG tablet Generic drug:  memantine TAKE 1 TABLET TWICE A DAY  FOR MEMORY   simvastatin 10 MG tablet Commonly known as:  ZOCOR TAKE ONE TABLET BY MOUTH ONCE DAILY FOR CHOLESTEROL   traZODone 150 MG tablet Commonly known as:  DESYREL TAKE 1 TABLET BY MOUTH EVERY DAY AT BEDTIME       Review of Systems:  Review of Systems  Constitutional: Negative for chills, fever and malaise/fatigue.  HENT: Negative for congestion, hearing loss and sore throat.   Eyes: Negative for  blurred vision.  Respiratory: Negative for shortness of breath.   Cardiovascular: Negative for chest pain, palpitations and leg swelling.  Gastrointestinal: Negative for abdominal pain, blood in stool, constipation and melena.  Genitourinary: Negative for dysuria.  Musculoskeletal: Negative for falls and myalgias.  Skin: Negative for itching and rash.  Neurological: Negative for dizziness, loss of consciousness and weakness.  Endo/Heme/Allergies: Bruises/bleeds easily.  Psychiatric/Behavioral: Positive for memory loss.       Ocd behavior    Health Maintenance  Topic Date Due  . INFLUENZA VACCINE  06/30/2016  . TETANUS/TDAP  08/09/2019  . DEXA SCAN  Completed  . ZOSTAVAX  Completed  . PNA vac Low Risk Adult  Completed    Physical Exam: Vitals:   08/24/16 1516  BP: 112/70  Pulse: 75  Temp: 98.3 F (36.8 C)  TempSrc: Oral  SpO2: 95%  Weight: 129 lb (58.5 kg)   Body mass index is 22.14  kg/m. Physical Exam  Constitutional: She appears well-developed and well-nourished. No distress.  Cardiovascular: Normal rate, regular rhythm, normal heart sounds and intact distal pulses.   Pulmonary/Chest: Effort normal and breath sounds normal. No respiratory distress.  Abdominal: Soft. Bowel sounds are normal.  Musculoskeletal: Normal range of motion.  Neurological: She is alert.  Short term memory loss over the visit  Skin: Skin is warm and dry. There is pallor.  Psychiatric: She has a normal mood and affect.    Labs reviewed: Basic Metabolic Panel:  Recent Labs  16/10/96 1503 12/12/15 03/02/16 2133  NA 138 141 141  K 3.5 4.0 3.9  CL 98  --  103  CO2 21  --  28  GLUCOSE 202*  --  113*  BUN 17 15 16   CREATININE 0.86 0.9 0.86  CALCIUM 9.6  --  9.9   Liver Function Tests:  Recent Labs  09/05/15 1503 12/12/15 03/02/16 2133  AST 19 26 28   ALT 12 15 19   ALKPHOS 56 58 56  BILITOT 0.3  --  0.4  PROT 6.7  --  7.2  ALBUMIN 3.9  --  3.6   No results for input(s): LIPASE, AMYLASE in the last 8760 hours. No results for input(s): AMMONIA in the last 8760 hours. CBC:  Recent Labs  09/05/15 1503 12/12/15 03/02/16 2133  WBC 4.0 3.5 4.4  NEUTROABS 2.1  --  2.3  HGB  --  13.6 13.1  HCT 39.2 40 39.5  MCV 85  --  86.8  PLT 267 253 193   Lipid Panel:  Recent Labs  12/12/15  CHOL 217*  HDL 68  LDLCALC 122  TRIG 045   Lab Results  Component Value Date   HGBA1C 6.0 12/12/2015     Assessment/Plan 1. Chronic deep vein thrombosis of lower extremity, unspecified laterality -cont coumadin therapy - POC INR  2. Anticoagulation goal of INR 2 to 3 - POC INR 2.7 and discussed at appt today--cont same dose and recheck INR at her annual next month  3. Alzheimer disease -stable recently, will reassess mmse at next AWV/CPE  4. Hyperlipidemia -last LDL 122, is on zocor 10mg , need to reassess before her AWV  5. Hyperglycemia -cont to monitor hba1c, diet highly  limited so hard to manage this  6. Insomnia -cont trazodone which works very well  7. Decreased appetite -she eats mostly soft foods and drinks shakes, requires potassium supplementation also due to this  8. Need for prophylactic vaccination and inoculation against influenza - Flu Vaccine QUAD 36+ mos IM (Fluarix &  Fluzone Quad PF was given  Labs/tests ordered:   Orders Placed This Encounter  Procedures  . Flu Vaccine QUAD 36+ mos IM (Fluarix & Fluzone Quad PF  . POC INR   Next appt:  Pt was supposed to be seen in 1 month for her already scheduled annual exam (per daughter); however, appt was changed to January when she left  Dequita Schleicher L. Jayleigh Notarianni, D.O. Geriatrics [facility] [address], [place] [address] Cell Phone (Mon-Fri 8am-5pm):  [phone] On Call:  [phone] & follow prompts after 5pm & weekends Office Phone:  [phone] Office Fax:  [phone]

## 2016-08-31 ENCOUNTER — Telehealth: Payer: Self-pay | Admitting: *Deleted

## 2016-08-31 NOTE — Telephone Encounter (Signed)
.  left message to have patient return my call.  

## 2016-08-31 NOTE — Telephone Encounter (Signed)
-----   Message from Kermit Baloiffany L Reed, DO sent at 08/28/2016  1:28 PM EDT ----- Geraldine Contrasee,   It appears that Faith Ortiz's appt was rescheduled to Jan from Oct when she and her daughter left.  Now her next appt is not until January and clearly she needs to have her INR next month as I discussed with them at the appt.  Can you please make sure this gets arranged?     Thanks.

## 2016-09-11 NOTE — Telephone Encounter (Signed)
Spoke with daughter and rescheduled appt

## 2016-09-14 ENCOUNTER — Other Ambulatory Visit: Payer: Self-pay

## 2016-09-15 ENCOUNTER — Other Ambulatory Visit: Payer: Self-pay

## 2016-09-17 ENCOUNTER — Other Ambulatory Visit: Payer: Federal, State, Local not specified - PPO

## 2016-09-17 DIAGNOSIS — I82509 Chronic embolism and thrombosis of unspecified deep veins of unspecified lower extremity: Principal | ICD-10-CM

## 2016-09-17 DIAGNOSIS — IMO0001 Reserved for inherently not codable concepts without codable children: Secondary | ICD-10-CM

## 2016-09-17 DIAGNOSIS — Z5181 Encounter for therapeutic drug level monitoring: Secondary | ICD-10-CM

## 2016-09-18 LAB — PROTIME-INR
INR: 3.4 — ABNORMAL HIGH
Prothrombin Time: 34.3 s — ABNORMAL HIGH (ref 9.0–11.5)

## 2016-09-22 ENCOUNTER — Other Ambulatory Visit: Payer: Self-pay | Admitting: Internal Medicine

## 2016-10-19 ENCOUNTER — Other Ambulatory Visit: Payer: Self-pay

## 2016-10-20 ENCOUNTER — Other Ambulatory Visit: Payer: Federal, State, Local not specified - PPO

## 2016-10-20 DIAGNOSIS — Z5181 Encounter for therapeutic drug level monitoring: Secondary | ICD-10-CM

## 2016-10-20 DIAGNOSIS — I82509 Chronic embolism and thrombosis of unspecified deep veins of unspecified lower extremity: Principal | ICD-10-CM

## 2016-10-20 DIAGNOSIS — IMO0001 Reserved for inherently not codable concepts without codable children: Secondary | ICD-10-CM

## 2016-10-21 ENCOUNTER — Other Ambulatory Visit: Payer: Self-pay

## 2016-10-21 LAB — PROTIME-INR
INR: 2.6 — ABNORMAL HIGH
Prothrombin Time: 26.3 s — ABNORMAL HIGH (ref 9.0–11.5)

## 2016-11-19 ENCOUNTER — Other Ambulatory Visit: Payer: Self-pay

## 2016-11-20 ENCOUNTER — Other Ambulatory Visit: Payer: Self-pay

## 2016-11-24 ENCOUNTER — Other Ambulatory Visit: Payer: Self-pay

## 2016-11-25 ENCOUNTER — Other Ambulatory Visit: Payer: Federal, State, Local not specified - PPO

## 2016-11-25 DIAGNOSIS — Z5181 Encounter for therapeutic drug level monitoring: Secondary | ICD-10-CM

## 2016-11-25 DIAGNOSIS — IMO0001 Reserved for inherently not codable concepts without codable children: Secondary | ICD-10-CM

## 2016-11-25 DIAGNOSIS — I82509 Chronic embolism and thrombosis of unspecified deep veins of unspecified lower extremity: Principal | ICD-10-CM

## 2016-11-25 LAB — PROTIME-INR
INR: 2.3 — ABNORMAL HIGH
Prothrombin Time: 23.9 s — ABNORMAL HIGH (ref 9.0–11.5)

## 2016-12-07 ENCOUNTER — Other Ambulatory Visit: Payer: Self-pay | Admitting: *Deleted

## 2016-12-07 MED ORDER — MEMANTINE HCL 10 MG PO TABS
ORAL_TABLET | ORAL | 3 refills | Status: DC
Start: 2016-12-07 — End: 2017-03-04

## 2016-12-07 NOTE — Telephone Encounter (Signed)
[facility]

## 2016-12-08 ENCOUNTER — Other Ambulatory Visit: Payer: Self-pay | Admitting: Internal Medicine

## 2016-12-09 ENCOUNTER — Telehealth: Payer: Self-pay | Admitting: *Deleted

## 2016-12-09 NOTE — Telephone Encounter (Signed)
Fine for just until after the cleaning, then restart coumadin.

## 2016-12-09 NOTE — Telephone Encounter (Signed)
Patient daughter, Elige Radonersis called and stated that patient is going to have a deep cleaning at her dentist tomorrow and the Dentist told her to stop her Coumadin the night before. Daughter wants to make sure this was ok to do. Please Advise.

## 2016-12-10 NOTE — Telephone Encounter (Signed)
Patient daughter notified and agreed.  

## 2016-12-14 ENCOUNTER — Encounter: Payer: Federal, State, Local not specified - PPO | Admitting: Internal Medicine

## 2016-12-14 ENCOUNTER — Ambulatory Visit: Payer: Federal, State, Local not specified - PPO

## 2016-12-19 ENCOUNTER — Other Ambulatory Visit: Payer: Self-pay | Admitting: Internal Medicine

## 2016-12-21 ENCOUNTER — Telehealth: Payer: Self-pay | Admitting: *Deleted

## 2016-12-21 MED ORDER — CYMBALTA 60 MG PO CPEP: 60.0000 mg | ORAL_CAPSULE | Freq: Every day | ORAL | 2 refills | Status: DC

## 2016-12-21 NOTE — Telephone Encounter (Signed)
Patient daughter called and stated that she wants BRAND name only called in for patient for Cymbalta to [facility].

## 2016-12-23 ENCOUNTER — Other Ambulatory Visit: Payer: Federal, State, Local not specified - PPO

## 2016-12-23 DIAGNOSIS — Z5181 Encounter for therapeutic drug level monitoring: Secondary | ICD-10-CM

## 2016-12-23 DIAGNOSIS — I82509 Chronic embolism and thrombosis of unspecified deep veins of unspecified lower extremity: Principal | ICD-10-CM

## 2016-12-23 DIAGNOSIS — IMO0001 Reserved for inherently not codable concepts without codable children: Secondary | ICD-10-CM

## 2016-12-24 ENCOUNTER — Other Ambulatory Visit: Payer: Self-pay | Admitting: Internal Medicine

## 2016-12-24 DIAGNOSIS — Z7901 Long term (current) use of anticoagulants: Principal | ICD-10-CM

## 2016-12-24 DIAGNOSIS — Z5181 Encounter for therapeutic drug level monitoring: Secondary | ICD-10-CM

## 2016-12-24 LAB — PROTIME-INR
INR: 2.1 — ABNORMAL HIGH
Prothrombin Time: 21.7 s — ABNORMAL HIGH (ref 9.0–11.5)

## 2017-01-18 ENCOUNTER — Other Ambulatory Visit: Payer: Self-pay | Admitting: Internal Medicine

## 2017-01-25 ENCOUNTER — Other Ambulatory Visit: Payer: Federal, State, Local not specified - PPO

## 2017-01-28 ENCOUNTER — Other Ambulatory Visit: Payer: Self-pay

## 2017-01-29 ENCOUNTER — Other Ambulatory Visit: Payer: Federal, State, Local not specified - PPO

## 2017-01-29 DIAGNOSIS — Z5181 Encounter for therapeutic drug level monitoring: Secondary | ICD-10-CM

## 2017-01-29 DIAGNOSIS — Z7901 Long term (current) use of anticoagulants: Principal | ICD-10-CM

## 2017-01-29 LAB — PROTIME-INR
INR: 2.2 — ABNORMAL HIGH
Prothrombin Time: 22.5 s — ABNORMAL HIGH (ref 9.0–11.5)

## 2017-02-03 ENCOUNTER — Other Ambulatory Visit: Payer: Self-pay | Admitting: Internal Medicine

## 2017-02-09 ENCOUNTER — Other Ambulatory Visit: Payer: Self-pay | Admitting: Internal Medicine

## 2017-02-09 NOTE — Telephone Encounter (Signed)
Patient caregiver requested Rx to be sent to [facility]. Out of medication. Requesting 4mg  Coumadin. Patient Has an appointment on 02/12/17 for CPE

## 2017-02-12 ENCOUNTER — Encounter: Payer: Federal, State, Local not specified - PPO | Admitting: Internal Medicine

## 2017-03-01 ENCOUNTER — Other Ambulatory Visit: Payer: Federal, State, Local not specified - PPO

## 2017-03-01 DIAGNOSIS — IMO0001 Reserved for inherently not codable concepts without codable children: Secondary | ICD-10-CM

## 2017-03-01 DIAGNOSIS — Z5181 Encounter for therapeutic drug level monitoring: Secondary | ICD-10-CM

## 2017-03-01 DIAGNOSIS — I82509 Chronic embolism and thrombosis of unspecified deep veins of unspecified lower extremity: Principal | ICD-10-CM

## 2017-03-02 LAB — PROTIME-INR
INR: 2.3 — ABNORMAL HIGH
Prothrombin Time: 23.9 s — ABNORMAL HIGH (ref 9.0–11.5)

## 2017-03-04 ENCOUNTER — Other Ambulatory Visit: Payer: Self-pay | Admitting: *Deleted

## 2017-03-04 ENCOUNTER — Telehealth: Payer: Self-pay

## 2017-03-04 MED ORDER — NAMENDA 10 MG PO TABS: ORAL_TABLET | ORAL | 3 refills | Status: DC

## 2017-03-04 NOTE — Telephone Encounter (Signed)
Called to see if patient wanted to switch to generic Namenda patient declined. Received faxed from [facility].

## 2017-03-09 ENCOUNTER — Other Ambulatory Visit: Payer: Self-pay | Admitting: Internal Medicine

## 2017-03-22 ENCOUNTER — Telehealth: Payer: Self-pay | Admitting: *Deleted

## 2017-03-22 NOTE — Telephone Encounter (Signed)
Patient called and requested MRI she had done with Dr. Renato Gails to be faxed to her Urologist Dr. Garlan Fillers to Fax: [phone] today for her appointment. Faxed.

## 2017-04-01 ENCOUNTER — Other Ambulatory Visit: Payer: Self-pay

## 2017-04-08 ENCOUNTER — Telehealth: Payer: Self-pay

## 2017-04-08 NOTE — Telephone Encounter (Signed)
That is fine for this time.

## 2017-04-08 NOTE — Telephone Encounter (Signed)
Patients daughter stated that she is out of town and would like to know if mother could get coumadin level checked at her physical appt on Monday.  Please advise

## 2017-04-09 ENCOUNTER — Other Ambulatory Visit: Payer: Self-pay | Admitting: Internal Medicine

## 2017-04-09 NOTE — Telephone Encounter (Signed)
Spoke with patients daughter to inform her that it is ok to wait until Monday to get her coumadin checked at her appt

## 2017-04-12 ENCOUNTER — Ambulatory Visit (INDEPENDENT_AMBULATORY_CARE_PROVIDER_SITE_OTHER): Payer: Federal, State, Local not specified - PPO | Admitting: Internal Medicine

## 2017-04-12 ENCOUNTER — Telehealth: Payer: Self-pay

## 2017-04-12 ENCOUNTER — Encounter: Payer: Self-pay | Admitting: Internal Medicine

## 2017-04-12 VITALS — BP 110/60 | HR 78 | Temp 98.1°F | Ht 64.0 in | Wt 131.0 lb

## 2017-04-12 DIAGNOSIS — R630 Anorexia: Secondary | ICD-10-CM | POA: Diagnosis not present

## 2017-04-12 DIAGNOSIS — I340 Nonrheumatic mitral (valve) insufficiency: Secondary | ICD-10-CM | POA: Diagnosis not present

## 2017-04-12 DIAGNOSIS — Z0000 Encounter for general adult medical examination without abnormal findings: Secondary | ICD-10-CM

## 2017-04-12 DIAGNOSIS — E785 Hyperlipidemia, unspecified: Secondary | ICD-10-CM

## 2017-04-12 DIAGNOSIS — H6123 Impacted cerumen, bilateral: Secondary | ICD-10-CM

## 2017-04-12 DIAGNOSIS — Z7901 Long term (current) use of anticoagulants: Secondary | ICD-10-CM

## 2017-04-12 DIAGNOSIS — I825Z9 Chronic embolism and thrombosis of unspecified deep veins of unspecified distal lower extremity: Secondary | ICD-10-CM

## 2017-04-12 DIAGNOSIS — R739 Hyperglycemia, unspecified: Secondary | ICD-10-CM | POA: Diagnosis not present

## 2017-04-12 DIAGNOSIS — Z5181 Encounter for therapeutic drug level monitoring: Secondary | ICD-10-CM | POA: Diagnosis not present

## 2017-04-12 DIAGNOSIS — G301 Alzheimer's disease with late onset: Secondary | ICD-10-CM | POA: Diagnosis not present

## 2017-04-12 DIAGNOSIS — F0280 Dementia in other diseases classified elsewhere without behavioral disturbance: Secondary | ICD-10-CM | POA: Diagnosis not present

## 2017-04-12 LAB — COMPLETE METABOLIC PANEL WITH GFR
ALT: 14 U/L (ref 6–29)
AST: 22 U/L (ref 10–35)
Albumin: 3.9 g/dL (ref 3.6–5.1)
Alkaline Phosphatase: 50 U/L (ref 33–130)
BUN: 24 mg/dL (ref 7–25)
CO2: 26 mmol/L (ref 20–31)
Calcium: 9.4 mg/dL (ref 8.6–10.4)
Chloride: 105 mmol/L (ref 98–110)
Creat: 0.94 mg/dL — ABNORMAL HIGH (ref 0.60–0.88)
GFR, Est African American: 65 mL/min (ref >=60)
GFR, Est Non African American: 56 mL/min — ABNORMAL LOW (ref >=60)
Glucose, Bld: 120 mg/dL — ABNORMAL HIGH (ref 65–99)
Potassium: 3.9 mmol/L (ref 3.5–5.3)
Sodium: 139 mmol/L (ref 135–146)
Total Bilirubin: 0.4 mg/dL (ref 0.2–1.2)
Total Protein: 6.6 g/dL (ref 6.1–8.1)

## 2017-04-12 LAB — CBC WITH DIFFERENTIAL/PLATELET
Basophils Absolute: 0 cells/uL (ref 0–200)
Basophils Relative: 0 %
Eosinophils Absolute: 80 cells/uL (ref 15–500)
Eosinophils Relative: 2 %
HCT: 42.9 % (ref 35.0–45.0)
Hemoglobin: 13.9 g/dL (ref 11.7–15.5)
Lymphocytes Relative: 42 %
Lymphs Abs: 1680 cells/uL (ref 850–3900)
MCH: 29.1 pg (ref 27.0–33.0)
MCHC: 32.4 g/dL (ref 32.0–36.0)
MCV: 89.9 fL (ref 80.0–100.0)
MPV: 10.3 fL (ref 7.5–12.5)
Monocytes Absolute: 240 cells/uL (ref 200–950)
Monocytes Relative: 6 %
Neutro Abs: 2000 cells/uL (ref 1500–7800)
Neutrophils Relative %: 50 %
Platelets: 199 10*3/uL (ref 140–400)
RBC: 4.77 MIL/uL (ref 3.80–5.10)
RDW: 13.6 % (ref 11.0–15.0)
WBC: 4 10*3/uL (ref 3.8–10.8)

## 2017-04-12 LAB — PROTIME-INR
INR: 2.7 — ABNORMAL HIGH
Prothrombin Time: 27.1 s — ABNORMAL HIGH (ref 9.0–11.5)

## 2017-04-12 NOTE — Progress Notes (Signed)
Location:  [facility] clinic Provider: Luci Bellucci L. Renato Gails, D.O., C.M.D.  Patient Care Team: Kermit Balo, DO as PCP - General (Geriatric Medicine)  Extended Emergency Contact Information Primary Emergency Contact: Clinton,Taleeya  [place] Mobile Phone: [phone] Relation: None  Code Status: full code Goals of Care: Advanced Directive information Advanced Directives 04/12/2017  Does Patient Have a Medical Advance Directive? No  Type of Advance Directive -  Copy of Healthcare Power of Attorney in Chart? -  Would patient like information on creating a medical advance directive? No - Patient declined   Chief Complaint  Patient presents with  . Annual Exam    CPE  . MMSE    HPI: Patient is a 83 y.o. female seen in today for an annual wellness exam and CPE.    Depression screen [facility] 2/9 04/12/2017 02/04/2015  Decreased Interest 0 0  Down, Depressed, Hopeless 0 0  PHQ - 2 Score 0 0    Fall Risk  04/12/2017 09/05/2015 08/16/2015 05/31/2015 02/04/2015  Falls in the past year? No No No No No   MMSE - Mini Mental State Exam 04/12/2017  Orientation to time 3  Orientation to Place 2  Registration 3  Attention/ Calculation 5  Recall 0  Language- name 2 objects 2  Language- repeat 1  Language- follow 3 step command 3  Language- read & follow direction 1  Write a sentence 1  Copy design 1  Total score 22    Health Maintenance  Topic Date Due  . INFLUENZA VACCINE  06/30/2017  . TETANUS/TDAP  08/09/2019  . DEXA SCAN  Completed  . PNA vac Low Risk Adult  Completed   Functional Status Survey: Is the patient deaf or have difficulty hearing?: No Does the patient have difficulty seeing, even when wearing glasses/contacts?: No Does the patient have difficulty concentrating, remembering, or making decisions?: Yes Does the patient have difficulty walking or climbing stairs?: No Does the patient have difficulty dressing or bathing?: No Does the patient have difficulty doing  errands alone such as visiting a doctor's office or shopping?: Yes Current Exercise Habits: Home exercise routine, Type of exercise: walking, Time (Minutes): 10, Frequency (Times/Week): 6, Weekly Exercise (Minutes/Week): 60, Intensity: Moderate Exercise limited by: None identified Diet?  Does not eat very well, does take boost with morning pills, her son in law cooks and she will eat a little bit of his cooking No exam data present  Sees well Hearing:  No problems Dentition:  Has all of her own teeth.  No issues.  Goes annually.    Past Medical History:  Diagnosis Date  . Alzheimer disease   . Cataract 2011-2013   left  . Depression   . Mitral regurgitation   . TIA (transient ischemic attack)     Past Surgical History:  Procedure Laterality Date  . ABDOMINAL HYSTERECTOMY    . APPENDECTOMY    . BLADDER REPAIR    . CESAREAN SECTION    . CESAREAN SECTION    . CESAREAN SECTION    . TONSILLECTOMY      Family History  Problem Relation Age of Onset  . Diabetes Father   . Arthritis Mother   . Breast cancer Sister   . Breast cancer Sister   . Stroke Sister   . Diabetes Brother   . Lung disease Brother   . Sarcoidosis Son 51  . Arthritis Son 77  . Gout Son 60  . Diabetes Daughter 33    Social  History   Social History  . Marital status: Married    Spouse name: N/A  . Number of children: N/A  . Years of education: N/A   Social History Main Topics  . Smoking status: Former Smoker    Packs/day: 0.00    Years: 5.00    Types: Cigarettes  . Smokeless tobacco: Never Used     Comment: Quit about age 40   . Alcohol use No  . Drug use: No  . Sexual activity: Not Asked   Other Topics Concern  . None   Social History Narrative   Diet- 2 boost a day, decreased appetite   Caffeine- 1 cup of coffee a day with 6 sugars   Married- 1988, Separated   House- Yes, 3 people   Pets- 2 dogs   Current/past profession- Retired Investment banker, operational   Exercise- Yes ( walk 1/4 mile  twice a week)   Living will/hcpoa-- In process ( daughter Chanti should make decision)   DNR- No       reports that she has quit smoking. Her smoking use included Cigarettes. She smoked 0.00 packs per day for 5.00 years. She has never used smokeless tobacco. She reports that she does not drink alcohol or use drugs.  Allergies  Allergen Reactions  . Codeine Other (See Comments)    unknown  . Contrast Media [Iodinated Diagnostic Agents] Other (See Comments)    Weakness,   . Darvon [Propoxyphene] Other (See Comments)    Weakness, Fainting  . Wine [Alcohol] Other (See Comments)    Dizzy   . Penicillins Rash    Allergies as of 04/12/2017      Reactions   Codeine Other (See Comments)   unknown   Contrast Media [iodinated Diagnostic Agents] Other (See Comments)   Weakness,    Darvon [propoxyphene] Other (See Comments)   Weakness, Fainting   Wine [alcohol] Other (See Comments)   Dizzy    Penicillins Rash      Medication List       Accurate as of 04/12/17  9:47 AM. Always use your most recent med list.          CALCIUM + D PO Take 1 tablet by mouth every morning.   CYMBALTA 60 MG capsule Generic drug:  DULoxetine Take 1 capsule (60 mg total) by mouth daily. BRAND NAME ONLY   KLOR-CON 8 MEQ tablet Generic drug:  potassium chloride TAKE 1 TABLET EVERY DAY   meclizine 12.5 MG tablet Commonly known as:  ANTIVERT Take 1 tablet (12.5 mg total) by mouth 3 (three) times daily as needed for dizziness.   NAMENDA 10 MG tablet Generic drug:  memantine Take one tablet by mouth twice daily for memory BRAND NAME ONLY!!   simvastatin 10 MG tablet Commonly known as:  ZOCOR TAKE ONE TABLET BY MOUTH ONCE DAILY FOR CHOLESTEROL   traZODone 150 MG tablet Commonly known as:  DESYREL TAKE 1 TABLET BY MOUTH EVERY DAY AT BEDTIME   warfarin 3 MG tablet Commonly known as:  COUMADIN Take 3 mg by mouth daily. Monday, Wednesday, Friday, and Sunday   COUMADIN 3 MG tablet Generic drug:   warfarin TAKE ONE TABLET BY MOUTH TUESDAY, THURSDAY AND SATURDAY   COUMADIN 4 MG tablet Generic drug:  warfarin TAKE 1 TABLET BY MOUTH EVERY DAY        Review of Systems:  Review of Systems  Constitutional: Negative for chills, fever, malaise/fatigue and weight loss.       Wt up  2 lbs which is good for her  HENT: Negative for congestion and hearing loss.        Bilateral cerumen impaction  Eyes: Negative for blurred vision.  Respiratory: Negative for cough and shortness of breath.   Cardiovascular: Negative for chest pain, palpitations and leg swelling.  Gastrointestinal: Negative for abdominal pain, blood in stool, constipation and melena.  Genitourinary: Negative for dysuria.  Musculoskeletal: Negative for back pain, falls, joint pain and myalgias.  Skin: Negative for itching and rash.  Neurological: Negative for dizziness, loss of consciousness and weakness.  Endo/Heme/Allergies: Bruises/bleeds easily.       On coumadin for DVT chronic  Psychiatric/Behavioral: Positive for memory loss. Negative for depression and suicidal ideas. The patient is not nervous/anxious and does not have insomnia.     Physical Exam: Vitals:   04/12/17 0909  BP: 110/60  Pulse: 78  Temp: 98.1 F (36.7 C)  TempSrc: Oral  SpO2: 97%  Weight: 131 lb (59.4 kg)  Height: 5\' 4"  (1.626 m)   Body mass index is 22.49 kg/m. Physical Exam  Constitutional: She appears well-developed and well-nourished. No distress.  HENT:  Head: Normocephalic and atraumatic.  Right Ear: External ear normal.  Left Ear: External ear normal.  Nose: Nose normal.  Mouth/Throat: Oropharynx is clear and moist. No oropharyngeal exudate.  Bilateral cerumen impaction  Eyes: Conjunctivae and EOM are normal. Pupils are equal, round, and reactive to light.  Neck: Normal range of motion. Neck supple. No JVD present.  Abdominal: Soft. Bowel sounds are normal. She exhibits no distension and no mass. There is no tenderness. There  is no rebound and no guarding.  Lymphadenopathy:    She has no cervical adenopathy.  Neurological: She is alert.  Skin: Skin is warm and dry. Capillary refill takes less than 2 seconds.  Psychiatric: She has a normal mood and affect.    Labs reviewed: Basic Metabolic Panel: No results for input(s): NA, K, CL, CO2, GLUCOSE, BUN, CREATININE, CALCIUM, MG, PHOS, TSH in the last 8760 hours. Liver Function Tests: No results for input(s): AST, ALT, ALKPHOS, BILITOT, PROT, ALBUMIN in the last 8760 hours. No results for input(s): LIPASE, AMYLASE in the last 8760 hours. No results for input(s): AMMONIA in the last 8760 hours. CBC: No results for input(s): WBC, NEUTROABS, HGB, HCT, MCV, PLT in the last 8760 hours. Lipid Panel: No results for input(s): CHOL, HDL, LDLCALC, TRIG, CHOLHDL, LDLDIRECT in the last 8760 hours. Lab Results  Component Value Date   HGBA1C 6.0 12/12/2015   EKG with NSR at 78bpm Assessment/Plan 1. Mitral valve insufficiency, unspecified etiology - chronic stable, no symptoms - EKG 12-Lead - CBC with Differential/Platelet - COMPLETE METABOLIC PANEL WITH GFR  2. Hyperlipidemia, unspecified hyperlipidemia type - lipids not recently checked--pt not fasting today so cannot assess today either - EKG 12-Lead - COMPLETE METABOLIC PANEL WITH GFR  3. Late onset Alzheimer's disease without behavioral disturbance - memory declining gradually--on namenda therapy - COMPLETE METABOLIC PANEL WITH GFR  4. Chronic deep vein thrombosis (DVT) of distal vein of lower extremity, unspecified laterality (HCC) - on chronic coumadin therapy with goal 2-3 - Protime-INR  5. Anticoagulation goal of INR 2 to 3 - cont current coumadin, last INR therapeutic, recheck with other labs today and schedule a 4 wk f/u INR in lab for sure (if needs to come sooner, will call and schedule) - Protime-INR  6. Decreased appetite -ongoing, but actually a bit better with 2 lb weight gain, eats small amts  of what her son in law cooks and drinks boost  7. Bilateral impacted cerumen -severe today, irrigated with warm water and peroxide  8. Annual physical exam -performed today   9. Medicare annual wellness visit, subsequent -performed as above today and flowsheets, clinical intake completed  10. Hyperglycemia -check hba1c today due to prior elevated glucose values  Labs/tests ordered:  Orders Placed This Encounter  Procedures  . CBC with Differential/Platelet  . COMPLETE METABOLIC PANEL WITH GFR  . Protime-INR  . Hemoglobin A1c  . EKG 12-Lead   Next appt:  6 mos med mgt, but 4 wks for INR in lab  Jaeley Wiker L. Damisha Wolff, D.O. Geriatrics [facility] [address], [place] [address] Cell Phone (Mon-Fri 8am-5pm):  [phone] On Call:  [phone] & follow prompts after 5pm & weekends Office Phone:  [phone] Office Fax:  [phone]

## 2017-04-12 NOTE — Telephone Encounter (Signed)
I called patient's daughter to let her know about lab results and she stated that patient's son was not happy about the way his mother's medication list has been messed up. She stated that patient's son phone number is [phone] and that we should contact him to discuss specific concerns.   I will call patient's son in the morning to discuss concerns at length.

## 2017-04-13 LAB — HEMOGLOBIN A1C
Hgb A1c MFr Bld: 5.5 % (ref <5.7)
Mean Plasma Glucose: 111 mg/dL

## 2017-04-13 NOTE — Telephone Encounter (Signed)
I spoke with patient's son in law, Faith Ortiz, and he stated that patient's medications that were listed on the AVS from yesterday's visit were wrong.   He stated that the AVS had three different dosages for coumadin listed. The incorrect dosage were:  Coumadin 3 mg, Take 3 mg on Tues, Thurs, Sat. Coumadin 4 mg, Take 4 mg by mouth every day Warfarin 3 mg, Take 3 mg by mouth daily. Mon, Wed, Fri, Sat.  He stated that the correct dosages were:  Coumadin 3 mg, Take 3 mg by mouth Mon, Wed, Fri, Sat.  Coumadin 4 mg, Take 4 mg by mouth Tues, Thurs, Sat.   He just wanted to make sure that providers knew that pt was not taking 7 mg daily. He stated there must have been a miscommunication with CMA during OV when medications were reviewed.   Patient's medication list was updated. Faith Ortiz did not have any other concerns at this time.

## 2017-04-13 NOTE — Telephone Encounter (Signed)
Ok good. I'm glad we have it up to date.

## 2017-04-13 NOTE — Telephone Encounter (Signed)
I left a message for Elwanda BrooklynDavid Clinton (patient's son-in-law) at [phone] asking that he call the office when available to discuss any concerns.

## 2017-04-13 NOTE — Telephone Encounter (Signed)
Please confirm exactly what coumadin the patient is taking. Pt's daughter has not been able to provide this information in the past due to traveling and we must make sure this is correct.

## 2017-05-10 ENCOUNTER — Other Ambulatory Visit: Payer: Self-pay | Admitting: Internal Medicine

## 2017-05-10 ENCOUNTER — Telehealth: Payer: Self-pay | Admitting: *Deleted

## 2017-05-10 MED ORDER — AMBULATORY NON FORMULARY MEDICATION: 0 refills | Status: DC

## 2017-05-10 NOTE — Telephone Encounter (Signed)
Order printed and faxed. Caregiver Notified and agreed.

## 2017-05-10 NOTE — Telephone Encounter (Signed)
Patient's caregiver returned called to give [name] a fax number for [facility] [place] [phone]

## 2017-05-10 NOTE — Telephone Encounter (Signed)
Patient caregiver, Julious OkaClinton Almee called and stated that patient is in [place] for a couple of months and wanting to get INR checked there at [facility] but needs an order. Caregiver will get phone number and fax number there and call us so we can fax order to have INR checked. Is this ok and will you address the results? Please Advise.

## 2017-05-10 NOTE — Telephone Encounter (Signed)
This is fine.  This will need to be done the same date as we had planned for her INR to be drawn here.

## 2017-05-11 ENCOUNTER — Other Ambulatory Visit: Payer: Federal, State, Local not specified - PPO

## 2017-05-12 ENCOUNTER — Telehealth: Payer: Self-pay | Admitting: *Deleted

## 2017-05-12 MED ORDER — AMBULATORY NON FORMULARY MEDICATION: 0 refills | Status: DC

## 2017-05-12 NOTE — Telephone Encounter (Signed)
[facility] from [facility] (client [phone]#:[id number], Pt VW:[id number]:1934/[birth year]/30 FPS) on patient's INR Results:  INR: 2.0 PT: 20.5  Current Dose of Coumadin: 3mg  M,W,F,Sun and 4mg  T,Thur,Sat.  Confirmed dosage with Clinton Netty. Patient is currently in [place] for a couple of months.  Please Advise.

## 2017-05-12 NOTE — Telephone Encounter (Signed)
Continue the same dose of coumadin and recheck INR in 4 weeks.

## 2017-05-12 NOTE — Telephone Encounter (Signed)
Patient daughter notified and faxed order to [facility] for 06/10/17.

## 2017-05-17 ENCOUNTER — Telehealth: Payer: Self-pay | Admitting: *Deleted

## 2017-05-17 NOTE — Telephone Encounter (Signed)
[facility] (Specimen:[id number] W Pt WU#[id number]#1933/12/22 FPS)  INR: 2.0 PT:  20.5  Current Dose of Coumadin: 3mg  M,W,Fri.,Sun and 4mg  Tue,Thurs,Sat. Patient is currently in [place]. Please Advise.   Previous INR: 05/12/17- 2.0

## 2017-05-17 NOTE — Telephone Encounter (Signed)
Continue same dose and recheck in 4 weeks

## 2017-05-17 NOTE — Telephone Encounter (Signed)
LMOM to return call.

## 2017-05-18 NOTE — Telephone Encounter (Signed)
LMOM to return call.

## 2017-05-24 NOTE — Telephone Encounter (Signed)
Patient daughter notified and agreed. She stated someone has already called her with these results.

## 2017-05-26 ENCOUNTER — Other Ambulatory Visit: Payer: Self-pay

## 2017-05-26 MED ORDER — WARFARIN SODIUM 4 MG PO TABS: ORAL_TABLET | ORAL | 3 refills | Status: DC

## 2017-05-26 NOTE — Telephone Encounter (Signed)
[facility] [place] for [facility]farin 4 mg.   I called patient to verify the dosage of warfarin 4 mg. The dosage is 4 mg on Tues, Thurs, and Sat.    Rx was sent to pharmacy electronically.

## 2017-05-27 ENCOUNTER — Other Ambulatory Visit: Payer: Self-pay | Admitting: *Deleted

## 2017-05-27 MED ORDER — WARFARIN SODIUM 4 MG PO TABS: ORAL_TABLET | ORAL | 3 refills | Status: DC

## 2017-05-27 NOTE — Telephone Encounter (Signed)
[facility] [place]  Stated patient prefers [facility] name only. Faxed.

## 2017-06-08 ENCOUNTER — Other Ambulatory Visit: Payer: Self-pay | Admitting: Internal Medicine

## 2017-06-10 ENCOUNTER — Other Ambulatory Visit: Payer: Self-pay | Admitting: *Deleted

## 2017-06-10 MED ORDER — SIMVASTATIN 10 MG PO TABS: 10.0000 mg | ORAL_TABLET | Freq: Every day | ORAL | 1 refills | Status: DC

## 2017-06-28 ENCOUNTER — Other Ambulatory Visit: Payer: Self-pay | Admitting: *Deleted

## 2017-06-28 MED ORDER — POTASSIUM CHLORIDE ER 8 MEQ PO TBCR: 8.0000 meq | EXTENDED_RELEASE_TABLET | Freq: Every day | ORAL | 0 refills | Status: DC

## 2017-06-28 NOTE — Telephone Encounter (Signed)
Patient daughter called and stated patient needs refill on medication. Visiting [place] and needs it faxed there.

## 2017-06-29 ENCOUNTER — Other Ambulatory Visit: Payer: Self-pay | Admitting: Internal Medicine

## 2017-07-08 ENCOUNTER — Other Ambulatory Visit: Payer: Federal, State, Local not specified - PPO

## 2017-07-08 DIAGNOSIS — Z5181 Encounter for therapeutic drug level monitoring: Secondary | ICD-10-CM

## 2017-07-08 DIAGNOSIS — IMO0001 Reserved for inherently not codable concepts without codable children: Secondary | ICD-10-CM

## 2017-07-08 DIAGNOSIS — I82509 Chronic embolism and thrombosis of unspecified deep veins of unspecified lower extremity: Principal | ICD-10-CM

## 2017-07-09 LAB — PROTIME-INR
INR: 2.3 — ABNORMAL HIGH
Prothrombin Time: 23.7 s — ABNORMAL HIGH (ref 9.0–11.5)

## 2017-07-26 ENCOUNTER — Other Ambulatory Visit: Payer: Self-pay | Admitting: Internal Medicine

## 2017-08-05 ENCOUNTER — Telehealth: Payer: Self-pay | Admitting: *Deleted

## 2017-08-05 ENCOUNTER — Other Ambulatory Visit: Payer: Self-pay | Admitting: *Deleted

## 2017-08-05 MED ORDER — AMBULATORY NON FORMULARY MEDICATION: 0 refills | Status: DC

## 2017-08-05 NOTE — Telephone Encounter (Signed)
-----   Message from Kermit Baloiffany L Reed, DO sent at 08/02/2017  2:51 PM EDT ----- Pt was to [facility] is [facility] staying--this was on June 10, 2017.  I see no documentation of receiving that result or any subsequent results.  Is someone else managing her coumadin while she is out of town?  Please call her daughter and confirm.  It is not safe for her to go months at a time w/o INR checks.    ----- Message ----- From: SYSTEM Sent: 08/01/2017  12:05 AM To: Kermit Baloiffany L Reed, DO

## 2017-08-05 NOTE — Telephone Encounter (Signed)
Spoke with patient's daughter and she wanted to order sent to [place]. Ordered in Epic and [facility]ed to  [facility] [place] [phone]

## 2017-08-10 LAB — POCT INR: INR: 2.5 — AB (ref 0.9–1.1)

## 2017-08-10 LAB — PROTIME-INR: PROTIME: 26.5 — AB (ref 10.0–13.8)

## 2017-08-11 ENCOUNTER — Telehealth: Payer: Self-pay | Admitting: *Deleted

## 2017-08-11 NOTE — Telephone Encounter (Signed)
Patient daughter Notified and agreed.  

## 2017-08-11 NOTE — Telephone Encounter (Signed)
[facility] Client #:[id number]#:[id number] Specimen:[id number] Requisition: [id number] Patient BJ:[id number]:[id number] [facility] ZH:[id number]   INR:  2.5  Current dose of Coumadin: 3mg  M,W,F,Sun and 4mg  Tues,Thurs,Sat. Patient is currently in [place] with daughter. Please Advise.

## 2017-08-11 NOTE — Telephone Encounter (Signed)
Continue current dose of coumadin and recheck INR in 4 weeks.  If she is still in [place], it can be done the same way as last time.  If she will be back, [facility].

## 2017-08-20 ENCOUNTER — Other Ambulatory Visit: Payer: Self-pay | Admitting: *Deleted

## 2017-08-20 MED ORDER — WARFARIN SODIUM 4 MG PO TABS: ORAL_TABLET | ORAL | 1 refills | Status: DC

## 2017-08-20 MED ORDER — WARFARIN SODIUM 3 MG PO TABS: ORAL_TABLET | ORAL | 1 refills | Status: DC

## 2017-08-20 NOTE — Telephone Encounter (Signed)
[facility] [place]

## 2017-08-23 ENCOUNTER — Other Ambulatory Visit: Payer: Self-pay | Admitting: *Deleted

## 2017-08-23 MED ORDER — WARFARIN SODIUM 4 MG PO TABS: ORAL_TABLET | ORAL | 0 refills | Status: DC

## 2017-08-26 ENCOUNTER — Other Ambulatory Visit: Payer: Self-pay | Admitting: Internal Medicine

## 2017-09-01 ENCOUNTER — Other Ambulatory Visit: Payer: Self-pay | Admitting: *Deleted

## 2017-09-01 MED ORDER — WARFARIN SODIUM 3 MG PO TABS: ORAL_TABLET | ORAL | 1 refills | Status: DC

## 2017-09-06 ENCOUNTER — Other Ambulatory Visit: Payer: Self-pay | Admitting: *Deleted

## 2017-09-06 MED ORDER — WARFARIN SODIUM 4 MG PO TABS: ORAL_TABLET | ORAL | 0 refills | Status: DC

## 2017-09-06 MED ORDER — WARFARIN SODIUM 3 MG PO TABS: ORAL_TABLET | ORAL | 0 refills | Status: DC

## 2017-09-06 NOTE — Telephone Encounter (Signed)
[facility] [place]

## 2017-09-17 ENCOUNTER — Other Ambulatory Visit: Payer: Self-pay | Admitting: *Deleted

## 2017-09-29 ENCOUNTER — Telehealth: Payer: Self-pay

## 2017-09-29 MED ORDER — AMBULATORY NON FORMULARY MEDICATION: 0 refills | Status: DC

## 2017-09-29 NOTE — Telephone Encounter (Signed)
Patient's daughter called to request order for PT/INR to be checked [facility] [place]  [facility] to [facility], refer to phone note dated 08/05/2017 for specifics.  Patient's daughter also asked if her mother needs to get the flu vaccine and questioned if she had prevnar.  I reviewed allergy list and previous vaccine history and informed Faith Ortiz that its recommended that she received flu vaccine and Prevnar is up to date.

## 2017-10-11 ENCOUNTER — Ambulatory Visit: Payer: Federal, State, Local not specified - PPO | Admitting: Internal Medicine

## 2017-10-13 ENCOUNTER — Other Ambulatory Visit: Payer: Self-pay | Admitting: Internal Medicine

## 2017-10-15 ENCOUNTER — Other Ambulatory Visit: Payer: Self-pay | Admitting: *Deleted

## 2017-10-15 MED ORDER — CYMBALTA 60 MG PO CPEP: 60.0000 mg | ORAL_CAPSULE | Freq: Every day | ORAL | 0 refills | Status: DC

## 2017-10-25 ENCOUNTER — Other Ambulatory Visit: Payer: Self-pay | Admitting: *Deleted

## 2017-10-25 MED ORDER — POTASSIUM CHLORIDE ER 8 MEQ PO TBCR: 8.0000 meq | EXTENDED_RELEASE_TABLET | Freq: Every day | ORAL | 0 refills | Status: DC

## 2017-12-01 ENCOUNTER — Encounter: Payer: Self-pay | Admitting: Nurse Practitioner

## 2017-12-01 ENCOUNTER — Ambulatory Visit (INDEPENDENT_AMBULATORY_CARE_PROVIDER_SITE_OTHER): Payer: Federal, State, Local not specified - PPO | Admitting: Nurse Practitioner

## 2017-12-01 VITALS — BP 114/72 | HR 89 | Temp 98.6°F | Resp 17 | Ht 64.0 in | Wt 126.0 lb

## 2017-12-01 DIAGNOSIS — Z5181 Encounter for therapeutic drug level monitoring: Secondary | ICD-10-CM | POA: Diagnosis not present

## 2017-12-01 DIAGNOSIS — I82509 Chronic embolism and thrombosis of unspecified deep veins of unspecified lower extremity: Secondary | ICD-10-CM | POA: Diagnosis not present

## 2017-12-01 DIAGNOSIS — IMO0001 Reserved for inherently not codable concepts without codable children: Secondary | ICD-10-CM

## 2017-12-01 DIAGNOSIS — Z7901 Long term (current) use of anticoagulants: Secondary | ICD-10-CM

## 2017-12-01 LAB — POCT INR: INR: 2.6

## 2017-12-01 NOTE — Patient Instructions (Signed)
Continue current coumadin regimen   Follow up with Edison Paceathey Miller for INR check in office

## 2017-12-01 NOTE — Progress Notes (Signed)
Careteam: Patient Care Team: Kermit Balo, DO as PCP - General (Geriatric Medicine)   Allergies  Allergen Reactions  . Codeine Other (See Comments)    unknown  . Contrast Media [Iodinated Diagnostic Agents] Other (See Comments)    Weakness,   . Darvon [Propoxyphene] Other (See Comments)    Weakness, Fainting  . Wine [Alcohol] Other (See Comments)    Dizzy   . Penicillins Rash    Chief Complaint  Patient presents with  . Acute Visit    Pt is being seen for a INR check. Current INR is 2.6, Previous 08/10/17 was 2.5     HPI: Patient is a 84 y.o. female seen in the office today to follow up INR. She lives between [place] and [place] and missed last INR check. Last INR we have records was in August 10, 2016 at 2.5, she has been on coumadin 3 mg Monday, Wednesday, Friday and Sunday and 4 mg all other days.  She has been on this dose for a long time.  No changes diet or medication.  No bruising or bleeding.  No falls.  Review of Systems:  Review of Systems  Constitutional: Negative for chills and fever.  Respiratory: Negative for cough and hemoptysis.   Cardiovascular: Negative for chest pain, palpitations and leg swelling.  Gastrointestinal: Negative for melena.  Genitourinary: Negative for hematuria.  Musculoskeletal: Negative for back pain, falls, joint pain and myalgias.  Neurological: Negative for dizziness and loss of consciousness.  Endo/Heme/Allergies: Bruises/bleeds easily.       On coumadin for DVT chronic  Psychiatric/Behavioral: Positive for memory loss. Negative for depression and suicidal ideas. The patient is not nervous/anxious and does not have insomnia.     Past Medical History:  Diagnosis Date  . Alzheimer disease   . Cataract 2011-2013   left  . Depression   . Mitral regurgitation   . TIA (transient ischemic attack)    Past Surgical History:  Procedure Laterality Date  . ABDOMINAL HYSTERECTOMY    . APPENDECTOMY    . BLADDER  REPAIR    . CESAREAN SECTION    . CESAREAN SECTION    . CESAREAN SECTION    . TONSILLECTOMY     Social History:   reports that she has quit smoking. Her smoking use included cigarettes. She smoked 0.00 packs per day for 5.00 years. she has never used smokeless tobacco. She reports that she does not drink alcohol or use drugs.  Family History  Problem Relation Age of Onset  . Diabetes Father   . Arthritis Mother   . Breast cancer Sister   . Breast cancer Sister   . Stroke Sister   . Diabetes Brother   . Lung disease Brother   . Sarcoidosis Son 18  . Arthritis Son 54  . Gout Son 60  . Diabetes Daughter 5    Medications:   Medication List        Accurate as of 12/01/17  3:23 PM. Always use your most recent med list.          AMBULATORY NON FORMULARY MEDICATION Please Draw PT/INR, due now Dx: Z51.81, I82.5Z9 and fax to Dr. Renato Gails @ [facility] Fax#:[phone]   CALCIUM + D PO   CYMBALTA 60 MG capsule Generic drug:  DULoxetine Take 1 capsule (60 mg total) daily by mouth.   meclizine 12.5 MG tablet Commonly known as:  ANTIVERT Take 1 tablet (12.5 mg total) by mouth 3 (three) times daily as  needed for dizziness.   NAMENDA 10 MG tablet Generic drug:  memantine Take one tablet by mouth twice daily for memory BRAND NAME ONLY!!   potassium chloride 8 MEQ tablet Commonly known as:  KLOR-CON Take 1 tablet (8 mEq total) by mouth daily.   simvastatin 10 MG tablet Commonly known as:  ZOCOR Take 1 tablet (10 mg total) by mouth daily.   traZODone 150 MG tablet Commonly known as:  DESYREL TAKE 1 TABLET BY MOUTH EVERY DAY AT BEDTIME   * warfarin 3 MG tablet Commonly known as:  COUMADIN Take as directed by the anticoagulation clinic. If you are unsure how to take this medication, talk to your nurse or doctor.   * warfarin 4 MG tablet Commonly known as:  COUMADIN Take as directed by the anticoagulation clinic. If you are unsure how to take this medication, talk  to your nurse or doctor. Original instructions:  Take 4 mg by mouth on Tues, Thurs, and Sat.      * This list has 2 medication(s) that are the same as other medications prescribed for you. Read the directions carefully, and ask your doctor or other care provider to review them with you.           Physical Exam:  Vitals:   12/01/17 1518  BP: 114/72  Pulse: 89  Resp: 17  Temp: 98.6 F (37 C)  TempSrc: Oral  SpO2: 97%  Weight: 126 lb (57.2 kg)  Height: 5\' 4"  (1.626 m)   Body mass index is 21.63 kg/m.  Physical Exam  Constitutional: She appears well-developed and well-nourished. No distress.  HENT:  Head: Normocephalic and atraumatic.  Mouth/Throat: No oropharyngeal exudate.  Eyes: Conjunctivae are normal. Pupils are equal, round, and reactive to light.  Neck: Normal range of motion. Neck supple.  Cardiovascular: Normal rate, regular rhythm and normal heart sounds.  Pulmonary/Chest: Effort normal and breath sounds normal.  Abdominal: Soft. Bowel sounds are normal.  Musculoskeletal: She exhibits no edema or tenderness.  Neurological: She is alert.  Skin: Skin is warm and dry. She is not diaphoretic.  Psychiatric: She has a normal mood and affect.    Labs reviewed: Basic Metabolic Panel: Recent Labs    04/12/17 1030  NA 139  K 3.9  CL 105  CO2 26  GLUCOSE 120*  BUN 24  CREATININE 0.94*  CALCIUM 9.4   Liver Function Tests: Recent Labs    04/12/17 1030  AST 22  ALT 14  ALKPHOS 50  BILITOT 0.4  PROT 6.6  ALBUMIN 3.9   No results for input(s): LIPASE, AMYLASE in the last 8760 hours. No results for input(s): AMMONIA in the last 8760 hours. CBC: Recent Labs    04/12/17 1030  WBC 4.0  NEUTROABS 2,000  HGB 13.9  HCT 42.9  MCV 89.9  PLT 199   Lipid Panel: No results for input(s): CHOL, HDL, LDLCALC, TRIG, CHOLHDL, LDLDIRECT in the last 8760 hours. TSH: No results for input(s): TSH in the last 8760 hours. A1C: Lab Results  Component Value Date     HGBA1C 5.5 04/12/2017     Assessment/Plan 1. Anticoagulation goal of INR 2 to 3 - POC INR- 2.6   2. DVT, recurrent, lower extremity, chronic, unspecified laterality (HCC) -no signs of recurrence. POC INR 2.6 today which is goal. Will cont current regimen.   Next appt: to follow up with Edison Paceathey Miller in 4 weeks for INR check Jerrad Mendibles K. Biagio BorgEubanks, AGNP  [facility]  [phone] 8 am - 5 pm) [phone] (after hours)

## 2017-12-05 ENCOUNTER — Other Ambulatory Visit: Payer: Self-pay | Admitting: Internal Medicine

## 2017-12-09 ENCOUNTER — Other Ambulatory Visit: Payer: Self-pay | Admitting: *Deleted

## 2017-12-09 ENCOUNTER — Other Ambulatory Visit: Payer: Self-pay

## 2017-12-09 MED ORDER — NAMENDA 10 MG PO TABS: ORAL_TABLET | ORAL | 3 refills | Status: DC

## 2017-12-09 MED ORDER — NAMENDA 10 MG PO TABS: ORAL_TABLET | ORAL | 0 refills | Status: DC

## 2017-12-09 NOTE — Telephone Encounter (Signed)
[facility]

## 2017-12-09 NOTE — Telephone Encounter (Signed)
[facility] supply to home in [place] in error. [facility]'s daughter [facility] and they are in process of correcting this and sending to [place], meanwhile patient needs a 10 day supply sent to local pharmacy to avoid delay in medication adherence   RX sent as requested

## 2017-12-27 ENCOUNTER — Ambulatory Visit: Payer: Self-pay | Admitting: Pharmacotherapy

## 2018-01-03 ENCOUNTER — Ambulatory Visit: Payer: Self-pay | Admitting: Pharmacotherapy

## 2018-01-07 ENCOUNTER — Ambulatory Visit: Payer: Self-pay | Admitting: Nurse Practitioner

## 2018-01-14 ENCOUNTER — Other Ambulatory Visit: Payer: Self-pay | Admitting: Internal Medicine

## 2018-01-17 ENCOUNTER — Other Ambulatory Visit: Payer: Self-pay | Admitting: *Deleted

## 2018-01-17 MED ORDER — POTASSIUM CHLORIDE ER 8 MEQ PO TBCR: 8.0000 meq | EXTENDED_RELEASE_TABLET | Freq: Every day | ORAL | 0 refills | Status: DC

## 2018-01-17 NOTE — Telephone Encounter (Signed)
[facility] [place] 

## 2018-01-24 ENCOUNTER — Encounter: Payer: Self-pay | Admitting: Internal Medicine

## 2018-02-03 ENCOUNTER — Ambulatory Visit: Payer: Self-pay | Admitting: Internal Medicine

## 2018-02-28 ENCOUNTER — Ambulatory Visit (INDEPENDENT_AMBULATORY_CARE_PROVIDER_SITE_OTHER): Payer: Federal, State, Local not specified - PPO | Admitting: Internal Medicine

## 2018-02-28 ENCOUNTER — Encounter: Payer: Self-pay | Admitting: Internal Medicine

## 2018-02-28 VITALS — BP 100/60 | HR 95 | Temp 97.9°F | Wt 127.0 lb

## 2018-02-28 DIAGNOSIS — I340 Nonrheumatic mitral (valve) insufficiency: Secondary | ICD-10-CM

## 2018-02-28 DIAGNOSIS — I82509 Chronic embolism and thrombosis of unspecified deep veins of unspecified lower extremity: Secondary | ICD-10-CM | POA: Diagnosis not present

## 2018-02-28 DIAGNOSIS — G301 Alzheimer's disease with late onset: Secondary | ICD-10-CM

## 2018-02-28 DIAGNOSIS — Z5181 Encounter for therapeutic drug level monitoring: Secondary | ICD-10-CM

## 2018-02-28 DIAGNOSIS — Z7901 Long term (current) use of anticoagulants: Secondary | ICD-10-CM

## 2018-02-28 DIAGNOSIS — F0280 Dementia in other diseases classified elsewhere without behavioral disturbance: Secondary | ICD-10-CM

## 2018-02-28 DIAGNOSIS — IMO0001 Reserved for inherently not codable concepts without codable children: Secondary | ICD-10-CM

## 2018-02-28 LAB — POCT INR: INR: 3

## 2018-02-28 MED ORDER — WARFARIN SODIUM 4 MG PO TABS
ORAL_TABLET | ORAL | 0 refills | Status: DC
Start: 2018-02-28 — End: 2018-10-24

## 2018-02-28 NOTE — Patient Instructions (Addendum)
Continue to have IN[place] checked while in [place]--you should definitely have it rechecked within the month. Let us know of changes or signs of bleeding while you are up there. We will see you back in August after your return.

## 2018-02-28 NOTE — Progress Notes (Signed)
Location:  [facility] clinic Provider:  Tiffany L. Renato Gails, D.O., C.M.D.  Code Status: Full Code  Goals of Care:  Advanced Directives 04/12/2017  Does Patient Have a Medical Advance Directive? No  Type of Advance Directive -  Copy of Healthcare Power of Attorney in Chart? -  Would patient like information on creating a medical advance directive? No - Patient declined     Chief Complaint  Patient presents with  . Medical Management of Chronic Issues    follow-up  . Anticoagulation    PT/INR    HPI: Patient is a 84 y.o. female seen today for medical management of chronic diseases.    Shortness of breath which she has always had, but daughter noticed when she is dressing she is showing the SOB. But reports it is not new, it just takes her some more time to get dress. She walks every day roughly 1/8 th of a mile. She does well with that.   Today her INR is 3, she has no signs of bleeding, reports taking the coumadin well. Daughter reports she is taking all of her medications well without issue.    Past Medical History:  Diagnosis Date  . Alzheimer disease   . Cataract 2011-2013   left  . Depression   . Mitral regurgitation   . TIA (transient ischemic attack)     Past Surgical History:  Procedure Laterality Date  . ABDOMINAL HYSTERECTOMY    . APPENDECTOMY    . BLADDER REPAIR    . CESAREAN SECTION    . CESAREAN SECTION    . CESAREAN SECTION    . TONSILLECTOMY      Allergies  Allergen Reactions  . Codeine Other (See Comments)    unknown  . Contrast Media [Iodinated Diagnostic Agents] Other (See Comments)    Weakness,   . Darvon [Propoxyphene] Other (See Comments)    Weakness, Fainting  . Wine [Alcohol] Other (See Comments)    Dizzy   . Penicillins Rash    Outpatient Encounter Medications as of 02/28/2018  Medication Sig  . Calcium Citrate-Vitamin D (CALCIUM + D PO) Take 1 tablet by mouth every morning.  . CYMBALTA 60 MG capsule TAKE 1 CAPSULE DAILY  . meclizine  (ANTIVERT) 12.5 MG tablet Take 1 tablet (12.5 mg total) by mouth 3 (three) times daily as needed for dizziness.  Marland Kitchen NAMENDA 10 MG tablet Take one tablet by mouth twice daily for memory BRAND NAME ONLY!!  . potassium chloride (KLOR-CON) 8 MEQ tablet Take 1 tablet (8 mEq total) by mouth daily.  . simvastatin (ZOCOR) 10 MG tablet TAKE 1 TABLET BY MOUTH EVERY DAY  . traZODone (DESYREL) 150 MG tablet TAKE 1 TABLET BY MOUTH EVERY DAY AT BEDTIME  . warfarin (COUMADIN) 3 MG tablet 3 mg. Take 3 mg by mouth on Mon, Wed, Fri, and Sun.  . warfarin (COUMADIN) 4 MG tablet TAKE 1 TABLET BY MOUTH ON TUESDAY, THURSDAY AND SATURDAY  . [DISCONTINUED] AMBULATORY NON FORMULARY MEDICATION Please Draw PT/INR, due now Dx: Z51.81, I82.5Z9 and fax to Dr. Renato Gails @ [facility] Fax#:[phone]  . [DISCONTINUED] CYMBALTA 60 MG capsule Take 1 capsule (60 mg total) daily by mouth.   No facility-administered encounter medications on file as of 02/28/2018.     Review of Systems:  Review of Systems  Constitutional: Negative for chills, fever and malaise/fatigue.  HENT: Negative for hearing loss.   Eyes: Negative for blurred vision.  Respiratory: Positive for shortness of breath. Negative for  cough.        SOB with dressing  Cardiovascular: Negative for chest pain, palpitations and leg swelling.  Gastrointestinal: Negative for blood in stool, constipation and diarrhea.  Genitourinary: Negative for frequency, hematuria and urgency.  Musculoskeletal: Negative for back pain, falls and neck pain.  Neurological: Negative for dizziness and headaches.  Endo/Heme/Allergies: Does not bruise/bleed easily.       On coumadin for chronic DVT  Psychiatric/Behavioral: Positive for memory loss. Negative for depression. The patient is not nervous/anxious and does not have insomnia.        Forgetfulness     Health Maintenance  Topic Date Due  . INFLUENZA VACCINE  06/30/2018  . TETANUS/TDAP  08/09/2019  . DEXA SCAN  Completed  .  PNA vac Low Risk Adult  Completed    Physical Exam: Vitals:   02/28/18 1043  BP: 100/60  Pulse: 95  Temp: 97.9 F (36.6 C)  TempSrc: Oral  SpO2: 97%  Weight: 127 lb (57.6 kg)   Body mass index is 21.8 kg/m. Physical Exam  Constitutional: She appears well-developed.  HENT:  Head: Normocephalic.  Cardiovascular: Normal rate, regular rhythm, normal heart sounds and intact distal pulses.  Pulmonary/Chest: Effort normal and breath sounds normal.  Abdominal: Soft. Bowel sounds are normal.  Musculoskeletal: Normal range of motion.  Neurological: She is alert.  Skin: Skin is warm and dry. Capillary refill takes less than 2 seconds.  Psychiatric: She has a normal mood and affect. Her behavior is normal. Judgment and thought content normal.  Vitals reviewed.   Labs reviewed: Basic Metabolic Panel: Recent Labs    04/12/17 1030  NA 139  K 3.9  CL 105  CO2 26  GLUCOSE 120*  BUN 24  CREATININE 0.94*  CALCIUM 9.4   Liver Function Tests: Recent Labs    04/12/17 1030  AST 22  ALT 14  ALKPHOS 50  BILITOT 0.4  PROT 6.6  ALBUMIN 3.9   No results for input(s): LIPASE, AMYLASE in the last 8760 hours. No results for input(s): AMMONIA in the last 8760 hours. CBC: Recent Labs    04/12/17 1030  WBC 4.0  NEUTROABS 2,000  HGB 13.9  HCT 42.9  MCV 89.9  PLT 199   Lipid Panel: No results for input(s): CHOL, HDL, LDLCALC, TRIG, CHOLHDL, LDLDIRECT in the last 8760 hours. Lab Results  Component Value Date   HGBA1C 5.5 04/12/2017    Procedures since last visit: No results found.  Assessment/Plan 1. Anticoagulation goal of INR 2 to 3 Her INR level is a 3 today in office, she has not been checked since Jan of this year. This has been a trend with her care. Explained to her and daughter the importance of the INR checks and the risk of avoiding them with spontaneous bleeding which can be fatal. Daughter reports that she will have her checked every 30 days while they are out  of state and will have the reports sent to [place]. For today will change her coumadin to 3 mg every day expect on Tues/Thur which will remain at a 4 mg.   - POC INR  2. DVT, recurrent, lower extremity, chronic, unspecified laterality (HCC) She is on coumadin for recurrent and chronic DVT. She is doing okay on the coumadin, but they need to be more aware of the safety of having INR checked. Did mention changing the medication so they could avoid this, but she and daughter opted to stay on coumadin.  3. Mitral valve insufficiency, unspecified  etiology This is chronic and stable. She has no symptoms of this today on exam.   4. 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 Will continue her Nameda at this time.     Labs/tests ordered:   Orders Placed This Encounter  Procedures  . POC INR       Next appt:  06/30/2018   Rosine DoorHannah Marie Samay Delcarlo, RN, DNP Student Geriatrics [facility] [phone] [address] [place], [place] [id number] Cell Phone (Mon-Fri 8am-5pm):  [phone] On Call:  [phone] & follow prompts after 5pm & weekends Office Phone:  [phone] Office Fax:  [phone]

## 2018-03-28 ENCOUNTER — Telehealth: Payer: Self-pay | Admitting: *Deleted

## 2018-03-28 MED ORDER — AMBULATORY NON FORMULARY MEDICATION: 0 refills | Status: DC

## 2018-03-28 NOTE — Telephone Encounter (Signed)
Daughter called requesting an order to have INR drawn at [facility] in [place]. Order placed and [facility]ed to [facility] [phone]  Please Draw PT/INR, due now Dx: Z51.81, I82.5Z9 and fax to Dr. Renato Gails @ [facility] Fax#: [phone]

## 2018-03-31 ENCOUNTER — Telehealth: Payer: Self-pay | Admitting: *Deleted

## 2018-03-31 NOTE — Telephone Encounter (Signed)
LMOM to return call.

## 2018-03-31 NOTE — Telephone Encounter (Signed)
Continue the same dose of coumadin and recheck INR in 4 weeks.  It is crucial that this is done. Results may be sent to me.

## 2018-03-31 NOTE — Telephone Encounter (Signed)
Received INR Results from [place], [place]. (Patient is out of town for a while and has drawn in [place])    INR:  2.3 PT:23.9  Current dose of Coumadin:  daily except Tues/Thurs. Please Advise.

## 2018-03-31 NOTE — Telephone Encounter (Signed)
Discussed results with patient. Patient verbalized Dr.Reed's comments about having PT/INR done within timeframe indicated and results being sent to [facility]

## 2018-04-16 ENCOUNTER — Other Ambulatory Visit: Payer: Self-pay | Admitting: Internal Medicine

## 2018-04-21 ENCOUNTER — Other Ambulatory Visit: Payer: Self-pay | Admitting: Internal Medicine

## 2018-04-26 ENCOUNTER — Telehealth: Payer: Self-pay | Admitting: *Deleted

## 2018-04-26 MED ORDER — AMBULATORY NON FORMULARY MEDICATION: 0 refills | Status: DC

## 2018-04-26 NOTE — Telephone Encounter (Signed)
Daughter called requesting an order to have INR drawn at [facility] in [place]. Order placed and faxed to [facility] [phone]

## 2018-04-27 MED ORDER — AMBULATORY NON FORMULARY MEDICATION: 0 refills | Status: DC

## 2018-04-27 NOTE — Addendum Note (Signed)
Addended by: Maurice Small on: 04/27/2018 03:15 PM   Modules accepted: Orders

## 2018-04-27 NOTE — Telephone Encounter (Addendum)
Incoming call from patient's daughter received, fax not received by [facility]. Patient is at [facility] right now and needs orders sent.   Order Re-faxed, I informed patient's daughter that we sent fax yesterday as requested and I was unsure as to why it was not received.  Patient's daughter requested that I send an order for July to avoid delay, order faxed

## 2018-04-28 ENCOUNTER — Telehealth: Payer: Self-pay | Admitting: *Deleted

## 2018-04-28 NOTE — Telephone Encounter (Signed)
Continue same dose of coumadin and recheck INR in 4 weeks.

## 2018-04-28 NOTE — Telephone Encounter (Signed)
LMOM to return call.

## 2018-04-28 NOTE — Telephone Encounter (Signed)
Received fax from [facility] [place] of [place] Results:  INR: 2.0 PT: 20.8  Current Dose of Coumadin:  daily except  Tues/Thurs. Please Advise.

## 2018-04-29 NOTE — Telephone Encounter (Signed)
Patient daughter notified and agreed.  

## 2018-05-03 ENCOUNTER — Other Ambulatory Visit: Payer: Self-pay | Admitting: *Deleted

## 2018-05-03 MED ORDER — COUMADIN 3 MG PO TABS: ORAL_TABLET | ORAL | 0 refills | Status: DC

## 2018-05-03 NOTE — Telephone Encounter (Signed)
[facility] [place][phone]

## 2018-05-28 LAB — PROTIME-INR: PROTIME: 23.8 — AB (ref 10.0–13.8)

## 2018-05-28 LAB — POCT INR: INR: 2.3 — AB (ref 0.9–1.1)

## 2018-05-31 ENCOUNTER — Telehealth: Payer: Self-pay | Admitting: *Deleted

## 2018-05-31 NOTE — Telephone Encounter (Signed)
Fax # is [phone]

## 2018-05-31 NOTE — Telephone Encounter (Signed)
Patient's daughter called and said that she has her mother with her in [place]. She was planning on having her fly back and purchased a non-refundable plane ticket. She now says that patient isn't up to traveling and she would like a letter from you stating that, so that she can get the ticket refunded. I asked exactly what was going on with patient to prevent her from traveling and she said that patient told her that she just wasn't comfortable doing so at this time. [phone](phone) Daughter will call back with a fax number.

## 2018-06-01 NOTE — Telephone Encounter (Signed)
I can complete this on Monday when I'm back in the office.  Could be typed up sooner, but can't be signed live until then.

## 2018-06-01 NOTE — Telephone Encounter (Signed)
Daughter notified and said that was fine. There was no rush.

## 2018-06-06 ENCOUNTER — Telehealth: Payer: Self-pay | Admitting: *Deleted

## 2018-06-06 NOTE — Telephone Encounter (Signed)
Realized I will need the date of the flight for the ticket to include in the letter.  Can we get this?  Thanks.

## 2018-06-06 NOTE — Telephone Encounter (Signed)
Spoke with daughter and advised results from INR and to continue the same dose and recheck INR in 4 weeks per Dr Renato Gailseed.  (copy of labs sent to [facility])

## 2018-06-07 ENCOUNTER — Encounter: Payer: Self-pay | Admitting: Internal Medicine

## 2018-06-07 NOTE — Telephone Encounter (Signed)
Letter was completed.  I will sign it on Thursday.

## 2018-06-07 NOTE — Telephone Encounter (Signed)
I spoke with patient's daughter and she stated that the date of the flight was supposed to be today 06/07/18.

## 2018-06-08 ENCOUNTER — Telehealth: Payer: Self-pay

## 2018-06-08 NOTE — Telephone Encounter (Signed)
Letter printed at [facility] and faxed to [phone]

## 2018-06-08 NOTE — Telephone Encounter (Signed)
Incoming fax received from [facility] recommending a generic equivalent to Namenda 10 mg.  Based on patient's current benefits patient will save at least $110 per fill by changing to generic alternative.  I called patient and left message on machine requesting return call to see if she is interested in changing to a lower-cost generic equivalent. Awaiting return call  Plan: If patient is in agreement, message to be forwarded to Dr.Reed for approval and rx to be sent to [facility].

## 2018-06-08 NOTE — Telephone Encounter (Signed)
Spoke with patient's daughter, patient would like to remain on brand name Namenda.

## 2018-06-10 ENCOUNTER — Telehealth: Payer: Self-pay | Admitting: *Deleted

## 2018-06-10 NOTE — Telephone Encounter (Signed)
"  Not feeling up to it" was not going to get her a refund.  That is why I included the diagnosis.  It was not sent directly to the airline.  She does not have to submit the letter if she does not want that information included.  I'm happy to change it, but there needs to be a good reason in the letter.  I don't recommend that her mother travels alone at this point due to her dementia.

## 2018-06-10 NOTE — Telephone Encounter (Signed)
Patient daughter, Faith Ortiz called and left message on Clinical intake stating that she received the Letter to excuse travel. Stated that she was Very surprised to see Diagnosis listed in the letter. Stated that this should not have been included since it violates HIPPA laws. Stated that you did not have permission from the patient to release this information. Stated she was Calling to inform us of this violation because she does not want this to happen to anyone else.

## 2018-06-13 ENCOUNTER — Other Ambulatory Visit: Payer: Self-pay | Admitting: *Deleted

## 2018-06-13 MED ORDER — SIMVASTATIN 10 MG PO TABS: 10.0000 mg | ORAL_TABLET | Freq: Every day | ORAL | 1 refills | Status: DC

## 2018-06-13 MED ORDER — TRAZODONE HCL 150 MG PO TABS: 150.0000 mg | ORAL_TABLET | Freq: Every day | ORAL | 1 refills | Status: DC

## 2018-06-13 NOTE — Telephone Encounter (Signed)
Signed.

## 2018-06-13 NOTE — Telephone Encounter (Signed)
Spoke with patient's daughter and she would like for Dr.Reed to change letter so that it does not include diagnosis. Ms.Clinton plans to submit letter today in hopes of getting a refund.  Fax revised letter to [phone]  Ms.Joni FearsClinton states her mother never travels alone, someone is always with her.  S.Chrae B/CMA

## 2018-06-13 NOTE — Telephone Encounter (Signed)
Please edit the letter removing the diagnoses and then I will sign it.

## 2018-06-13 NOTE — Telephone Encounter (Signed)
[facility] [place]

## 2018-06-13 NOTE — Telephone Encounter (Signed)
Letter recomposed and placed in Dr.Reed's urgent review and sign folder

## 2018-06-14 ENCOUNTER — Other Ambulatory Visit: Payer: Self-pay | Admitting: Internal Medicine

## 2018-06-14 NOTE — Telephone Encounter (Signed)
The revised letter has been faxed to [phone]. A message was left on voicemail for Faith Ortiz letting her know that the letter was faxed.   The letter was placed in Faith Ortiz's MA folder in clinical filing cabinet.

## 2018-06-28 ENCOUNTER — Telehealth: Payer: Self-pay

## 2018-06-28 MED ORDER — AMBULATORY NON FORMULARY MEDICATION: 0 refills | Status: DC

## 2018-06-28 NOTE — Telephone Encounter (Signed)
Patient's daughter Faith Ortiz called requesting PT/INR order to be faxed to [facility] in [place] [phone], Patient plans to be in [place] another month  Faxed as requested

## 2018-06-30 ENCOUNTER — Telehealth: Payer: Self-pay | Admitting: *Deleted

## 2018-06-30 ENCOUNTER — Ambulatory Visit: Payer: Federal, State, Local not specified - PPO | Admitting: Internal Medicine

## 2018-06-30 LAB — PROTIME-INR: Protime: 25.4 — AB (ref 10.0–13.8)

## 2018-06-30 LAB — POCT INR: INR: 2.4 — AB (ref 0.9–1.1)

## 2018-06-30 NOTE — Telephone Encounter (Signed)
Received fax from [facility] regarding patient's PT/INR Results:  INR: 2.4 PT:  25.4  Current Dose of Coumadin: 3mg  daily except 4mg  Tuesday and Thursday. Please Advise.

## 2018-06-30 NOTE — Telephone Encounter (Signed)
Left message on daughters VM with results, advised her to call if any questions or problems

## 2018-06-30 NOTE — Telephone Encounter (Signed)
Continue the same dose of coumadin and recheck INR in 4 weeks.

## 2018-07-04 ENCOUNTER — Telehealth: Payer: Self-pay

## 2018-07-04 NOTE — Telephone Encounter (Signed)
I left a message for patient's daughter to call the office to discuss recent INR results.    Pt INR on 07/04/18 was 2.4 and her Pt was 25.4.   Per Dr. Renato Gailseed- pt should continue same coumadin dose and recheck INR in 4 weeks.

## 2018-07-05 NOTE — Telephone Encounter (Signed)
I spoke with patient's daughter and she verbalized understanding. She did not have any questions at this time. 

## 2018-07-19 ENCOUNTER — Other Ambulatory Visit: Payer: Self-pay | Admitting: *Deleted

## 2018-07-19 MED ORDER — CYMBALTA 60 MG PO CPEP: 60.0000 mg | ORAL_CAPSULE | Freq: Every day | ORAL | 1 refills | Status: DC

## 2018-07-26 ENCOUNTER — Other Ambulatory Visit: Payer: Self-pay | Admitting: Internal Medicine

## 2018-07-28 ENCOUNTER — Other Ambulatory Visit: Payer: Self-pay | Admitting: Internal Medicine

## 2018-07-29 ENCOUNTER — Telehealth: Payer: Self-pay

## 2018-07-29 MED ORDER — AMBULATORY NON FORMULARY MEDICATION: 0 refills | Status: DC

## 2018-07-29 NOTE — Addendum Note (Signed)
Addended by: Chriss DriverLANE, TERRY L on: 07/29/2018 01:35 PM   Modules accepted: Orders

## 2018-07-29 NOTE — Telephone Encounter (Signed)
Patient's daughter called to request that an order for a PT/INR lab draw be faxed to [facility] in [place].   Order was printed from chart and faxed to [phone].   Pt daughter was notified that order was sent.

## 2018-08-02 ENCOUNTER — Telehealth: Payer: Self-pay | Admitting: *Deleted

## 2018-08-02 NOTE — Telephone Encounter (Signed)
Received PT/INR Results from [facility]  PT:  25.1 INR:  2.4  Current Dose of Coumadin: 3mg  daily except 4mg  on Tuesday and Thursday. Please Advise.   Forwarded results to Dr. Glade Lloyd. (Dr. Renato Gails out of office)

## 2018-08-02 NOTE — Telephone Encounter (Signed)
Continue current regimen. Check INR in 4 weeks.

## 2018-08-03 NOTE — Telephone Encounter (Signed)
Patient daughter notified and agreed.  

## 2018-08-29 ENCOUNTER — Other Ambulatory Visit: Payer: Self-pay | Admitting: *Deleted

## 2018-08-29 MED ORDER — AMBULATORY NON FORMULARY MEDICATION: 0 refills | Status: DC

## 2018-08-29 NOTE — Telephone Encounter (Signed)
Patient daughter requested an order to be faxed to [facility] for patient to have PT/INR drawn. Faxed order [phone]  Daughter notified order faxed.

## 2018-09-01 LAB — PROTIME-INR: Protime: 20.8 — AB (ref 10.0–13.8)

## 2018-09-01 LAB — POCT INR: INR: 2 — AB (ref 0.9–1.1)

## 2018-09-02 ENCOUNTER — Telehealth: Payer: Self-pay

## 2018-09-02 NOTE — Telephone Encounter (Signed)
Current PT/INR 20.8/2.0  Last PT/INR 25.1/2.4  Current Dose of Coumadin: 3mg  daily except 4mg  on Tuesday and Thursday. Please Advise.

## 2018-09-02 NOTE — Telephone Encounter (Signed)
Discuss response with patient's daughter Ms.Clinton who verbalized understanding of instructions.  S.Chrae B/CMA

## 2018-09-02 NOTE — Telephone Encounter (Signed)
Continue the same dose of coumadin and recheck INR in 4 weeks. 

## 2018-09-02 NOTE — Telephone Encounter (Signed)
I left a message for patient's daughter to call the office regarding results.

## 2018-09-07 ENCOUNTER — Other Ambulatory Visit: Payer: Self-pay | Admitting: *Deleted

## 2018-09-07 MED ORDER — NAMENDA 10 MG PO TABS: ORAL_TABLET | ORAL | 1 refills | Status: DC

## 2018-09-07 NOTE — Telephone Encounter (Signed)
[facility]

## 2018-09-28 ENCOUNTER — Other Ambulatory Visit: Payer: Self-pay | Admitting: *Deleted

## 2018-09-28 MED ORDER — AMBULATORY NON FORMULARY MEDICATION: 0 refills | Status: DC

## 2018-09-28 NOTE — Telephone Encounter (Signed)
Daughter called and requested order to be faxed to [facility] in [place].   Order printed from chart and faxed to Fax: [phone]  Patient daughter notified.

## 2018-09-29 ENCOUNTER — Telehealth: Payer: Self-pay | Admitting: *Deleted

## 2018-09-29 NOTE — Telephone Encounter (Signed)
Received fax from [facility]  INR: 1.7 PT: 16.9  Current Dose of Coumadin: 3mg  daily except 4mg  on Tuesday and Thursday. Please Advise.

## 2018-09-29 NOTE — Telephone Encounter (Signed)
I'm wondering if she missed a dose because she'd been running therapeutic on the same dose for so long.  Or, she may have had too many greens lately?  It's challenging managing this from a distance and seeing the patient 1-2 times a year.  I'd recommend she continue the same dose right now and recheck in 2 weeks

## 2018-09-30 NOTE — Telephone Encounter (Signed)
Left message on daughter's voicemail and advised to call if any questions

## 2018-10-22 ENCOUNTER — Other Ambulatory Visit: Payer: Self-pay | Admitting: Internal Medicine

## 2018-10-29 ENCOUNTER — Other Ambulatory Visit: Payer: Self-pay | Admitting: Internal Medicine

## 2018-11-01 ENCOUNTER — Telehealth: Payer: Self-pay | Admitting: *Deleted

## 2018-11-01 MED ORDER — AMBULATORY NON FORMULARY MEDICATION: 0 refills | Status: DC

## 2018-11-01 NOTE — Telephone Encounter (Signed)
Daughter called requesting INR draw order.   INR draw to be faxed to [facility] in [place].   Order was printed from chart and faxed to [phone].   Pt daughter was notified

## 2018-11-08 ENCOUNTER — Telehealth: Payer: Self-pay

## 2018-11-08 ENCOUNTER — Other Ambulatory Visit: Payer: Self-pay

## 2018-11-08 ENCOUNTER — Other Ambulatory Visit: Payer: Federal, State, Local not specified - PPO

## 2018-11-08 DIAGNOSIS — I825Z9 Chronic embolism and thrombosis of unspecified deep veins of unspecified distal lower extremity: Secondary | ICD-10-CM

## 2018-11-08 DIAGNOSIS — Z5181 Encounter for therapeutic drug level monitoring: Secondary | ICD-10-CM

## 2018-11-08 DIAGNOSIS — Z7901 Long term (current) use of anticoagulants: Principal | ICD-10-CM

## 2018-11-08 NOTE — Telephone Encounter (Signed)
Order placed. Patient's daughter will be asked to schedule a follow up appointment when they arrive tomorrow.

## 2018-11-08 NOTE — Telephone Encounter (Signed)
Patient's daughter called to request an order for an INR to be drawn at the office. She insisted on scheduling the earliest appointment available tomorrow due to patient being 10 days overdue for INR check because of traveling.    Please advise if order can be placed.

## 2018-11-08 NOTE — Telephone Encounter (Signed)
That's fine.  She will also need an appt for a regular visit if she's back in town.  I don't see any appts on file.

## 2018-11-09 ENCOUNTER — Other Ambulatory Visit: Payer: Federal, State, Local not specified - PPO

## 2018-11-09 LAB — PROTIME-INR
INR: 2.8 — ABNORMAL HIGH
Prothrombin Time: 27.6 s — ABNORMAL HIGH (ref 9.0–11.5)

## 2018-12-09 ENCOUNTER — Other Ambulatory Visit: Payer: Self-pay | Admitting: Internal Medicine

## 2018-12-12 ENCOUNTER — Telehealth: Payer: Self-pay | Admitting: *Deleted

## 2018-12-12 NOTE — Telephone Encounter (Signed)
Extra strength tylenol is ok

## 2018-12-12 NOTE — Telephone Encounter (Signed)
Daughter called back and wanted to make sure patient can take Extra Strength Tylenol. Please Advise.

## 2018-12-12 NOTE — Telephone Encounter (Signed)
Daughter calling stating that pt is in pain from  Swollen glands in her throat and need something for pain. Per verbal from Dr. Renato Gails ok to take tylenol 500mg  prn

## 2018-12-13 ENCOUNTER — Encounter: Payer: Self-pay | Admitting: Family

## 2018-12-13 ENCOUNTER — Other Ambulatory Visit: Payer: Self-pay | Admitting: Internal Medicine

## 2018-12-13 ENCOUNTER — Ambulatory Visit (INDEPENDENT_AMBULATORY_CARE_PROVIDER_SITE_OTHER): Payer: Federal, State, Local not specified - PPO | Admitting: Family

## 2018-12-13 VITALS — BP 118/78 | HR 102 | Temp 98.0°F | Ht 64.0 in | Wt 128.0 lb

## 2018-12-13 DIAGNOSIS — Z7901 Long term (current) use of anticoagulants: Secondary | ICD-10-CM

## 2018-12-13 DIAGNOSIS — Z5181 Encounter for therapeutic drug level monitoring: Secondary | ICD-10-CM

## 2018-12-13 DIAGNOSIS — R599 Enlarged lymph nodes, unspecified: Secondary | ICD-10-CM | POA: Diagnosis not present

## 2018-12-13 LAB — POCT INR: INR: 2.1 (ref 2.0–3.0)

## 2018-12-13 MED ORDER — DOXYCYCLINE HYCLATE 100 MG PO TABS: 100.0000 mg | ORAL_TABLET | Freq: Two times a day (BID) | ORAL | 0 refills | Status: AC

## 2018-12-13 NOTE — Patient Instructions (Addendum)
1.Decrease Coumadin to 3 mg tablet one by mouth daily while on doxycycline 2. Follow up in 2 weeks for INR check and submandibular swelling evaluation  3. Notify provider or go to ER if symptoms worsen or running any fever > 100.5  4.Please get CBC/diff drawn then will call you with lab results

## 2018-12-13 NOTE — Progress Notes (Signed)
Provider: Caswell Alvillar FNP-C  Kermit Baloeed, Tiffany L, DO  Patient Care Team: Kermit Baloeed, Tiffany L, DO as PCP - General (Geriatric Medicine)  Extended Emergency Contact Information Primary Emergency Contact: Clinton,[place]  [place] Mobile Phone: [phone] Relation: None  Goals of care: Advanced Directive information Advanced Directives 12/13/2018  Does Patient Have a Medical Advance Directive? No  Type of Advance Directive -  Copy of Healthcare Power of Attorney in Chart? -  Would patient like information on creating a medical advance directive? No - Patient declined     Chief Complaint  Patient presents with  . Acute Visit    Swollen glands since yesterday drank almond milk and think it may have contributed   . Anticoagulation    INR Check 2.1     HPI:  Pt is a 85 y.o. female seen today for an acute visit for evaluation of INR and left neck swelling.she is escorted today by her daughter.Her INR today was 2.1 with goal of 2-3 for chronic DVT.she has been taking coumadin as directed 3 mg tablet on Monday,wednesday,friday and sunday and coumadin 4 mg tablet Saturday,Tuesday and Thursday. she denies any signs of bleeding. Patient's daughter concerned that patient has a left jaw swollen gland.she noticed swelling after patient drunk almond milk wonders if patient is allergic to nuts. She states swelling has gone down today but still very tender to touch.patient states swelling is painful.she has taken tylenol for pain.she denies any fever,chills,difficulty chewing or swallowing.she also denies any difficulties breathing or shortness of breath.patient's daughter states patient has had similar swelling in the past about two years ago and was treated with oral antibiotics with much improvement.   Past Medical History:  Diagnosis Date  . Alzheimer disease (HCC)   . Cataract 2011-2013   left  . Depression   . Mitral regurgitation   . TIA (transient ischemic attack)    Past  Surgical History:  Procedure Laterality Date  . ABDOMINAL HYSTERECTOMY    . APPENDECTOMY    . BLADDER REPAIR    . CESAREAN SECTION    . CESAREAN SECTION    . CESAREAN SECTION    . TONSILLECTOMY      Allergies  Allergen Reactions  . Codeine Other (See Comments)    unknown  . Contrast Media [Iodinated Diagnostic Agents] Other (See Comments)    Weakness,   . Darvon [Propoxyphene] Other (See Comments)    Weakness, Fainting  . Wine [Alcohol] Other (See Comments)    Dizzy   . Penicillins Rash    Outpatient Encounter Medications as of 12/13/2018  Medication Sig  . AMBULATORY NON FORMULARY MEDICATION Please Draw PT/INR, Due around 11/01/2018 Dx: U98.11Z51.81, I82.5Z9 Please Fax to Dr. Renato Gailseed, [facility] Fax#:[phone]  . Calcium Citrate-Vitamin D (CALCIUM + D PO) Take 1 tablet by mouth every morning.  Marland Kitchen. COUMADIN 3 MG tablet TAKE 1 TABLET BY MOUTH ON MONDAY,WEDNESDAY , FRIDAY AND SUNDAY  . CYMBALTA 60 MG capsule TAKE 1 CAPSULE (60 MG TOTAL) DAILY BY MOUTH.  . meclizine (ANTIVERT) 12.5 MG tablet Take 1 tablet (12.5 mg total) by mouth 3 (three) times daily as needed for dizziness.  Marland Kitchen. NAMENDA 10 MG tablet Take one tablet by mouth twice daily for memory BRAND NAME ONLY!!  . potassium chloride (KLOR-CON) 8 MEQ tablet TAKE 1 TABLET (8 MEQ TOTAL) BY MOUTH DAILY.  . simvastatin (ZOCOR) 10 MG tablet TAKE 1 TABLET BY MOUTH EVERY DAY  . traZODone (DESYREL) 150 MG tablet TAKE 1 TABLET (  150 MG TOTAL) BY MOUTH AT BEDTIME.  Marland Kitchen. doxycycline (VIBRA-TABS) 100 MG tablet Take 1 tablet (100 mg total) by mouth 2 (two) times daily for 10 days.   No facility-administered encounter medications on file as of 12/13/2018.     Review of Systems  Constitutional: Negative for appetite change, chills, fatigue and fever.  HENT: Negative for congestion, ear discharge, ear pain, postnasal drip, rhinorrhea, sinus pressure, sinus pain, sneezing, sore throat, trouble swallowing and voice change.   Eyes: Negative for  discharge, redness and itching.  Respiratory: Negative for cough, chest tightness, shortness of breath and wheezing.   Cardiovascular: Negative for chest pain, palpitations and leg swelling.  Skin: Negative for color change, pallor and rash.       Left jaw swelling per HPI   Neurological: Negative for dizziness, light-headedness and headaches.  Hematological: Bruises/bleeds easily.  Psychiatric/Behavioral: Negative for agitation, confusion and sleep disturbance.    Immunization History  Administered Date(s) Administered  . Hepatitis B 03/22/2003  . Influenza, High Dose Seasonal PF 10/02/2017, 09/17/2018  . Influenza,inj,Quad PF,6+ Mos 08/19/2015, 08/24/2016  . Influenza-Unspecified 09/27/2014  . PPD Test 03/22/2003  . Pneumococcal Conjugate-13 10/02/2014  . Pneumococcal Polysaccharide-23 08/02/2007  . Td 06/05/2000  . Tdap 08/08/2009  . Zoster 02/27/2015   Pertinent  Health Maintenance Due  Topic Date Due  . INFLUENZA VACCINE  06/30/2018  . DEXA SCAN  Completed  . PNA vac Low Risk Adult  Completed   Fall Risk  12/13/2018 02/28/2018 12/01/2017 04/12/2017 09/05/2015  Falls in the past year? 0 No No No No  Number falls in past yr: 0 - - - -  Injury with Fall? 0 - - - -    Vitals:   12/13/18 1611  BP: 118/78  Pulse: (!) 102  Temp: 98 F (36.7 C)  TempSrc: Oral  SpO2: 96%  Weight: 128 lb (58.1 kg)  Height: 5\' 4"  (1.626 m)   Body mass index is 21.97 kg/m. Physical Exam Vitals signs reviewed.  Constitutional:      General: She is not in acute distress.    Appearance: Normal appearance. She is normal weight. She is not ill-appearing.  HENT:     Head: Normocephalic.     Jaw: Tenderness and swelling present.     Right Ear: There is impacted cerumen.     Left Ear: There is impacted cerumen.     Nose: Nose normal. No congestion or rhinorrhea.     Mouth/Throat:     Mouth: Mucous membranes are dry.     Dentition: No dental abscesses.     Pharynx: Oropharynx is clear. No  oropharyngeal exudate or posterior oropharyngeal erythema.     Tonsils: No tonsillar exudate.  Eyes:     General: No scleral icterus.       Right eye: No discharge.        Left eye: No discharge.     Conjunctiva/sclera: Conjunctivae normal.     Pupils: Pupils are equal, round, and reactive to light.  Neck:     Musculoskeletal: Normal range of motion. No edema, erythema or neck rigidity.     Thyroid: No thyroid mass or thyroid tenderness.     Comments: Submandibular gland golf size swelling tender to touch.No drainage or redness noted.  Cardiovascular:     Rate and Rhythm: Normal rate and regular rhythm.     Pulses: Normal pulses.     Heart sounds: Normal heart sounds. No murmur. No friction rub. No gallop.   Pulmonary:  Effort: Pulmonary effort is normal. No respiratory distress.     Breath sounds: Normal breath sounds. No wheezing, rhonchi or rales.  Chest:     Chest wall: No tenderness.  Abdominal:     General: Abdomen is flat. Bowel sounds are normal. There is no distension.     Palpations: Abdomen is soft. There is no mass.     Tenderness: There is no abdominal tenderness. There is no guarding or rebound.  Musculoskeletal: Normal range of motion.        General: No swelling or tenderness.     Right lower leg: No edema.     Left lower leg: No edema.  Skin:    General: Skin is warm and dry.     Coloration: Skin is not pale.     Findings: No bruising, erythema, lesion or rash.  Neurological:     Mental Status: She is alert. Mental status is at baseline.  Psychiatric:        Mood and Affect: Mood normal.        Behavior: Behavior normal.        Thought Content: Thought content normal.    Labs reviewed:  Lab Results  Component Value Date   TSH 2.310 03/12/2015   Lab Results  Component Value Date   HGBA1C 5.5 04/12/2017   Lab Results  Component Value Date   CHOL 217 (A) 12/12/2015   HDL 68 12/12/2015   LDLCALC 122 12/12/2015   TRIG 137 12/12/2015   CHOLHDL  3.1 03/12/2015    Significant Diagnostic Results in last 30 days:  No results found.  Assessment/Plan 1. Anticoagulation goal of INR 2 to 3 INR therapeutic 2.1 today.Currently on 3 mg tablet on Monday,wednesday,friday and sunday and coumadin 4 mg tablet Saturday,Tuesday and Thursday.starting on Doxycycline 100 mg tablet twice daily for left submandibular gland swelling.will reduce coumadin to 3 mg tablet daily while on antibiotics.Recheck INR in 2 weeks. Notify provider's office or go to ER for signs of bleeding.   - POC INR  2. Swollen gland Recurrent submandibular gland swelling golf size,tender to touch.No redness or drainage noted.exam finding negative for shortness of breath or difficulty swallowing or chewing.Start on Doxycycline 100 mg tablet twice daily x 10 days.  - CBC with Differential/Platelet - Notify provider's office or go to ER  for worsening swelling,pain,shrtness of breath/wheezing or difficulty swallowing. - follow up in 2 weeks for evaluation.   Family/ staff Communication: Reviewed plan of care with patient and daughter.   Labs/tests ordered: CBC/diff today.   Follow Up: 2 weeks for submandibular gland swelling evaluation and INR check.   Caesar Bookman, NP

## 2018-12-13 NOTE — Telephone Encounter (Signed)
Daughter Notified

## 2018-12-14 ENCOUNTER — Other Ambulatory Visit: Payer: Self-pay

## 2018-12-14 ENCOUNTER — Telehealth: Payer: Self-pay

## 2018-12-14 LAB — CBC WITH DIFFERENTIAL/PLATELET
Absolute Monocytes: 360 cells/uL (ref 200–950)
BASOS PCT: 0.2 %
Basophils Absolute: 11 cells/uL (ref 0–200)
Eosinophils Absolute: 138 cells/uL (ref 15–500)
Eosinophils Relative: 2.6 %
HCT: 39.9 % (ref 35.0–45.0)
Hemoglobin: 13.6 g/dL (ref 11.7–15.5)
LYMPHS ABS: 2555 {cells}/uL (ref 850–3900)
MCH: 30.4 pg (ref 27.0–33.0)
MCHC: 34.1 g/dL (ref 32.0–36.0)
MCV: 89.3 fL (ref 80.0–100.0)
MPV: 11.3 fL (ref 7.5–12.5)
Monocytes Relative: 6.8 %
Neutro Abs: 2237 cells/uL (ref 1500–7800)
Neutrophils Relative %: 42.2 %
Platelets: 230 10*3/uL (ref 140–400)
RBC: 4.47 10*6/uL (ref 3.80–5.10)
RDW: 12.7 % (ref 11.0–15.0)
Total Lymphocyte: 48.2 %
WBC: 5.3 10*3/uL (ref 3.8–10.8)

## 2018-12-14 MED ORDER — NAMENDA 10 MG PO TABS: ORAL_TABLET | ORAL | 0 refills | Status: DC

## 2018-12-14 NOTE — Telephone Encounter (Signed)
Called patient to see if they would be able to come for labs needed for today. Spoke with daughter. Daughter stated she would bring patient between 1:30 and before 4 pm to have labs drawn.

## 2018-12-14 NOTE — Telephone Encounter (Signed)
Patient daughter stated patient is in need a refill for approximately 10 tabs of Namenda 10 mg tablets sent to her pharmacy. Patient was just seen in the office yesterday.

## 2018-12-20 ENCOUNTER — Other Ambulatory Visit: Payer: Self-pay | Admitting: *Deleted

## 2018-12-20 MED ORDER — NAMENDA 10 MG PO TABS: ORAL_TABLET | ORAL | 0 refills | Status: DC

## 2018-12-20 MED ORDER — NAMENDA 10 MG PO TABS: ORAL_TABLET | ORAL | 3 refills | Status: DC

## 2018-12-20 NOTE — Telephone Encounter (Signed)
Daughter, Sarahy called and requested #4 refill of Namenda to be sent to [facility] until she can get her overnighted mail order Rx sent to [facility]. Faxed.

## 2018-12-21 ENCOUNTER — Telehealth: Payer: Self-pay

## 2018-12-21 NOTE — Telephone Encounter (Signed)
Patient and daughter called stating [facility] will not process rx for Namenda unless we call them to confirm that patient needs to be dispensed as Brand Name Only. Patient's daughter states she informed them rx Brand Name for about 5 years yet they need to hear from [place] prior to dispensing.  I called [facility] and recording stated I need to call number on the back of members ID card

## 2018-12-21 NOTE — Telephone Encounter (Signed)
I was able to contact [facility] @ [phone] and informed pharmacy representative to dispense Brand Name only. RX will be processed for shipping   I left message informing patient

## 2019-01-30 ENCOUNTER — Other Ambulatory Visit: Payer: Self-pay | Admitting: Internal Medicine

## 2019-01-31 ENCOUNTER — Other Ambulatory Visit: Payer: Self-pay

## 2019-01-31 ENCOUNTER — Other Ambulatory Visit: Payer: Self-pay | Admitting: *Deleted

## 2019-01-31 DIAGNOSIS — I825Z9 Chronic embolism and thrombosis of unspecified deep veins of unspecified distal lower extremity: Secondary | ICD-10-CM

## 2019-01-31 DIAGNOSIS — Z7901 Long term (current) use of anticoagulants: Principal | ICD-10-CM

## 2019-01-31 DIAGNOSIS — Z5181 Encounter for therapeutic drug level monitoring: Secondary | ICD-10-CM

## 2019-01-31 MED ORDER — POTASSIUM CHLORIDE ER 8 MEQ PO TBCR: 8.0000 meq | EXTENDED_RELEASE_TABLET | Freq: Every day | ORAL | 0 refills | Status: DC

## 2019-01-31 NOTE — Telephone Encounter (Signed)
[facility] [place] 

## 2019-02-01 ENCOUNTER — Other Ambulatory Visit: Payer: Self-pay

## 2019-02-06 ENCOUNTER — Telehealth: Payer: Self-pay | Admitting: Internal Medicine

## 2019-02-06 NOTE — Telephone Encounter (Signed)
Please call pt's daughter as pt has not come for her INR check.  It's been two months since the last one.

## 2019-02-06 NOTE — Telephone Encounter (Signed)
Spoke with daughter and advised she needs lab appt, lab appt scheduled

## 2019-02-07 ENCOUNTER — Other Ambulatory Visit: Payer: Self-pay

## 2019-02-08 ENCOUNTER — Other Ambulatory Visit: Payer: Self-pay

## 2019-02-10 ENCOUNTER — Other Ambulatory Visit: Payer: Self-pay

## 2019-02-10 ENCOUNTER — Other Ambulatory Visit: Payer: Federal, State, Local not specified - PPO

## 2019-02-10 LAB — PROTIME-INR
INR: 2.4 — ABNORMAL HIGH
Prothrombin Time: 23.7 s — ABNORMAL HIGH (ref 9.0–11.5)

## 2019-02-21 ENCOUNTER — Other Ambulatory Visit: Payer: Self-pay | Admitting: Internal Medicine

## 2019-03-23 ENCOUNTER — Ambulatory Visit: Payer: Self-pay | Admitting: Internal Medicine

## 2019-03-26 ENCOUNTER — Other Ambulatory Visit: Payer: Self-pay | Admitting: Internal Medicine

## 2019-03-27 ENCOUNTER — Other Ambulatory Visit: Payer: Self-pay

## 2019-03-27 ENCOUNTER — Encounter: Payer: Self-pay | Admitting: Internal Medicine

## 2019-03-27 ENCOUNTER — Ambulatory Visit (INDEPENDENT_AMBULATORY_CARE_PROVIDER_SITE_OTHER): Payer: Federal, State, Local not specified - PPO | Admitting: Internal Medicine

## 2019-03-27 VITALS — HR 84 | Temp 97.6°F

## 2019-03-27 DIAGNOSIS — G301 Alzheimer's disease with late onset: Secondary | ICD-10-CM | POA: Diagnosis not present

## 2019-03-27 DIAGNOSIS — Z5181 Encounter for therapeutic drug level monitoring: Secondary | ICD-10-CM | POA: Diagnosis not present

## 2019-03-27 DIAGNOSIS — B351 Tinea unguium: Secondary | ICD-10-CM

## 2019-03-27 DIAGNOSIS — I825Z9 Chronic embolism and thrombosis of unspecified deep veins of unspecified distal lower extremity: Secondary | ICD-10-CM

## 2019-03-27 DIAGNOSIS — F0280 Dementia in other diseases classified elsewhere without behavioral disturbance: Secondary | ICD-10-CM

## 2019-03-27 LAB — POCT INR: INR: 2.5 (ref 2.0–3.0)

## 2019-03-27 NOTE — Patient Instructions (Signed)
Continue current dose of coumadin Recheck INR in 4 weeks (car visit or in our office if open at that time).

## 2019-03-27 NOTE — Progress Notes (Signed)
Location:  [facility] clinic Provider:  Ogden Handlin L. Renato Gails, D.O., C.M.D.  Code Status: DNR Goals of Care:  Advanced Directives 12/13/2018  Does Patient Have a Medical Advance Directive? No  Type of Advance Directive -  Copy of Healthcare Power of Attorney in Chart? -  Would patient like information on creating a medical advance directive? No - Patient declined     Chief Complaint  Patient presents with  . car visit    PT/INR    HPI: Patient is a 85 y.o. female seen today for INR check.  INR was 2.5.  Her daughter has struggled to get her here for a coumadin check due to her dementia.  She sometimes does not want to leave her home.    The only other concern they have today is her toenail fungus.  They have begun to remove her socks overnight to give them air.  They are not using any antifungal polish or lamisil orally at this point.  We reviewed treatment options.   Past Medical History:  Diagnosis Date  . Alzheimer disease (HCC)   . Cataract 2011-2013   left  . Depression   . Mitral regurgitation   . TIA (transient ischemic attack)     Past Surgical History:  Procedure Laterality Date  . ABDOMINAL HYSTERECTOMY    . APPENDECTOMY    . BLADDER REPAIR    . CESAREAN SECTION    . CESAREAN SECTION    . CESAREAN SECTION    . TONSILLECTOMY      Allergies  Allergen Reactions  . Codeine Other (See Comments)    unknown  . Contrast Media [Iodinated Diagnostic Agents] Other (See Comments)    Weakness,   . Darvon [Propoxyphene] Other (See Comments)    Weakness, Fainting  . Wine [Alcohol] Other (See Comments)    Dizzy   . Penicillins Rash    Outpatient Encounter Medications as of 03/27/2019  Medication Sig  . AMBULATORY NON FORMULARY MEDICATION Please Draw PT/INR, Due around 11/01/2018 Dx: G01.74, I82.5Z9 Please Fax to Dr. Renato Gails, [facility] Fax#:[phone]  . Calcium Citrate-Vitamin D (CALCIUM + D PO) Take 1 tablet by mouth every morning.  Marland Kitchen COUMADIN 3 MG tablet TAKE  3MG  BY MOUTH ON MONDAY,WEDNESDAY , FRIDAY AND SUNDAY  . COUMADIN 4 MG tablet TAKE 1 TABLET BY MOUTH ON TUESDAY, THURSDAY AND SATURDAY  . CYMBALTA 60 MG capsule TAKE 1 CAPSULE DAILY  . meclizine (ANTIVERT) 12.5 MG tablet Take 1 tablet (12.5 mg total) by mouth 3 (three) times daily as needed for dizziness.  Marland Kitchen NAMENDA 10 MG tablet Take one tablet by mouth twice daily for memory BRAND NAME ONLY!!  . potassium chloride (KLOR-CON) 8 MEQ tablet TAKE 1 TABLET BY MOUTH EVERY DAY  . simvastatin (ZOCOR) 10 MG tablet TAKE 1 TABLET BY MOUTH EVERY DAY  . traZODone (DESYREL) 150 MG tablet TAKE 1 TABLET (150 MG TOTAL) BY MOUTH AT BEDTIME.   No facility-administered encounter medications on file as of 03/27/2019.     Review of Systems:  Review of Systems  Constitutional: Negative for chills, fever and malaise/fatigue.  HENT: Negative for congestion.   Eyes: Negative for blurred vision.  Respiratory: Negative for cough and shortness of breath.   Cardiovascular: Negative for chest pain, palpitations and leg swelling.  Gastrointestinal: Negative for abdominal pain, blood in stool, constipation, diarrhea and melena.  Genitourinary: Negative for dysuria.  Musculoskeletal: Negative for myalgias.  Skin: Negative for itching and rash.  Fungal toenails  Neurological: Negative for dizziness and loss of consciousness.  Endo/Heme/Allergies: Bruises/bleeds easily.  Psychiatric/Behavioral: Positive for memory loss. Negative for depression.    Health Maintenance  Topic Date Due  . INFLUENZA VACCINE  07/01/2019  . TETANUS/TDAP  08/09/2019  . DEXA SCAN  Completed  . PNA vac Low Risk Adult  Completed    Physical Exam: There were no vitals filed for this visit. There is no height or weight on file to calculate BMI. Physical Exam Constitutional:      Appearance: Normal appearance.  HENT:     Head: Normocephalic and atraumatic.  Pulmonary:     Effort: Pulmonary effort is normal.  Musculoskeletal: Normal  range of motion.  Neurological:     General: No focal deficit present.     Mental Status: She is alert. Mental status is at baseline.  Psychiatric:        Mood and Affect: Mood normal.     Labs reviewed: Basic Metabolic Panel: No results for input(s): NA, K, CL, CO2, GLUCOSE, BUN, CREATININE, CALCIUM, MG, PHOS, TSH in the last 8760 hours. Liver Function Tests: No results for input(s): AST, ALT, ALKPHOS, BILITOT, PROT, ALBUMIN in the last 8760 hours. No results for input(s): LIPASE, AMYLASE in the last 8760 hours. No results for input(s): AMMONIA in the last 8760 hours. CBC: Recent Labs    12/14/18 1539  WBC 5.3  NEUTROABS 2,237  HGB 13.6  HCT 39.9  MCV 89.3  PLT 230   Lipid Panel: No results for input(s): CHOL, HDL, LDLCALC, TRIG, CHOLHDL, LDLDIRECT in the last 8760 hours. Lab Results  Component Value Date   HGBA1C 5.5 04/12/2017    Assessment/Plan 1. Chronic deep vein thrombosis (DVT) of distal vein of lower extremity, unspecified laterality (HCC) -cont coumadin for INR goal 2-3 -today was perfect at 2.5  2. Encounter for therapeutic drug monitoring -INR check today at goal with current coumadin 4mg  tues/thurs/sat and 3mg  mon, wed, fri, sun - POC INR  3. Late onset Alzheimer's disease without behavioral disturbance (HCC) -cont namenda and support from her daughter at home -cont trazodone for sleep  4.  Onychomycosis -recommended otc antifungal topically -avoid lamisil at her age and function with hepatic risks -she does not care to do anything about it besides continue to remove socks at hs  Labs/tests ordered:  INR in 4 wks (dtr to call us to schedule appt)  Next appt:  04/20/2019  Sierra Bissonette L. Shayne Diguglielmo, D.O. Geriatrics [facility] [address] [place], [place] [address] Cell Phone (Mon-Fri 8am-5pm):  [phone] On Call:  [phone] & follow prompts after 5pm & weekends Office Phone:  [phone] Office Fax:   [phone]

## 2019-04-11 ENCOUNTER — Other Ambulatory Visit: Payer: Self-pay | Admitting: Internal Medicine

## 2019-04-20 ENCOUNTER — Ambulatory Visit: Payer: Federal, State, Local not specified - PPO | Admitting: Internal Medicine

## 2019-04-20 ENCOUNTER — Other Ambulatory Visit: Payer: Self-pay

## 2019-04-27 ENCOUNTER — Encounter: Payer: Self-pay | Admitting: Internal Medicine

## 2019-04-27 ENCOUNTER — Ambulatory Visit (INDEPENDENT_AMBULATORY_CARE_PROVIDER_SITE_OTHER): Payer: Federal, State, Local not specified - PPO | Admitting: Internal Medicine

## 2019-04-27 ENCOUNTER — Other Ambulatory Visit: Payer: Self-pay

## 2019-04-27 VITALS — HR 81 | Temp 98.2°F

## 2019-04-27 DIAGNOSIS — I825Z9 Chronic embolism and thrombosis of unspecified deep veins of unspecified distal lower extremity: Secondary | ICD-10-CM

## 2019-04-27 DIAGNOSIS — I340 Nonrheumatic mitral (valve) insufficiency: Secondary | ICD-10-CM | POA: Diagnosis not present

## 2019-04-27 DIAGNOSIS — Z5181 Encounter for therapeutic drug level monitoring: Secondary | ICD-10-CM | POA: Diagnosis not present

## 2019-04-27 DIAGNOSIS — R05 Cough: Secondary | ICD-10-CM

## 2019-04-27 DIAGNOSIS — R059 Cough, unspecified: Secondary | ICD-10-CM

## 2019-04-27 LAB — POCT INR: INR: 1.6 — AB (ref 2.0–3.0)

## 2019-04-27 NOTE — Progress Notes (Signed)
[facility] Provider:  Janaisha Tolsma L. Renato Gailseed, D.O., C.M.D.  Goals of Care:  Advanced Directives 12/13/2018  Does Patient Have a Medical Advance Directive? No  Type of Advance Directive -  Copy of Healthcare Power of Attorney in Chart? -  Would patient like information on creating a medical advance directive? No - Patient declined   Chief Complaint  Patient presents with  . Anticoagulation    PT/INR    HPI: Patient is a 85 y.o. female seen today for medical management of chronic diseases.    INR 1.6 today down from goal of 2-3 (was 2.5 last visit a month ago).  Pt's daughter wanted me to listen to patient but wanted to have her own stethoscope rather than ours and did not have one.  They reported no changes in her doses or her diet.  She's on 3mg  alternating with 4mg .  Discussed that maybe a pill was missed.  It had indeed been missed.  That was 4 nights ago (4mg ).    Ms. Faith Ortiz says her mom has a leaky heart valve and was worried fluid was building up.  She's no longer coughing after water and honey for one day.  Then cough returned last night around 3pm, but has come back.  She wanted to have her own stethoscope for me to listen.  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    Past Medical History:  Diagnosis Date  . Alzheimer disease (HCC)   . Cataract 2011-2013   left  . Depression   . Mitral regurgitation   . TIA (transient ischemic attack)     Past Surgical History:  Procedure Laterality Date  . ABDOMINAL HYSTERECTOMY    . APPENDECTOMY    . BLADDER REPAIR    . CESAREAN SECTION    . CESAREAN SECTION    . CESAREAN SECTION    . TONSILLECTOMY      Allergies  Allergen Reactions  . Codeine Other (See Comments)    unknown  . Contrast Media [Iodinated Diagnostic Agents] Other (See Comments)    Weakness,   . Darvon [Propoxyphene] Other (See Comments)    Weakness, Fainting  . Wine [Alcohol] Other (See Comments)    Dizzy   . Penicillins Rash    Outpatient Encounter  Medications as of 04/27/2019  Medication Sig  . Calcium Citrate-Vitamin D (CALCIUM + D PO) Take 1 tablet by mouth every morning.  Marland Kitchen. COUMADIN 3 MG tablet TAKE 1 TABLET BY MOUTH ON MONDAY,WEDNESDAY , FRIDAY AND SUNDAY  . COUMADIN 4 MG tablet TAKE 1 TABLET BY MOUTH ON TUESDAY, THURSDAY AND SATURDAY  . CYMBALTA 60 MG capsule TAKE 1 CAPSULE DAILY  . meclizine (ANTIVERT) 12.5 MG tablet Take 1 tablet (12.5 mg total) by mouth 3 (three) times daily as needed for dizziness.  Marland Kitchen. NAMENDA 10 MG tablet Take one tablet by mouth twice daily for memory BRAND NAME ONLY!!  . potassium chloride (KLOR-CON) 8 MEQ tablet TAKE 1 TABLET BY MOUTH EVERY DAY  . simvastatin (ZOCOR) 10 MG tablet TAKE 1 TABLET BY MOUTH EVERY DAY  . traZODone (DESYREL) 150 MG tablet TAKE 1 TABLET (150 MG TOTAL) BY MOUTH AT BEDTIME.  . [DISCONTINUED] AMBULATORY NON FORMULARY MEDICATION Please Draw PT/INR, Due around 11/01/2018 Dx: Z51.81, I82.5Z9 Please Fax to Dr. Renato Gailseed, [facility] Fax#:[phone]   No facility-administered encounter medications on file as of 04/27/2019.     Review of Systems:  Review of Systems  Constitutional: Negative for chills, fever and malaise/fatigue.  HENT: Negative for congestion.   Eyes: Negative for blurred vision.  Respiratory: Positive for cough. Negative for shortness of breath.   Cardiovascular: Negative for chest pain, palpitations and leg swelling.  Gastrointestinal: Negative for abdominal pain, blood in stool, constipation, diarrhea and melena.  Genitourinary: Negative for dysuria.  Musculoskeletal: Negative for falls and joint pain.  Skin: Negative for itching and rash.  Neurological: Negative for dizziness and loss of consciousness.  Endo/Heme/Allergies: Bruises/bleeds easily.  Psychiatric/Behavioral: Positive for memory loss. Negative for depression.    Health Maintenance  Topic Date Due  . INFLUENZA VACCINE  07/01/2019  . TETANUS/TDAP  08/09/2019  . DEXA SCAN  Completed  . PNA vac  Low Risk Adult  Completed    Physical Exam: Vitals:   04/27/19 1110  Pulse: 81  Temp: 98.2 F (36.8 C)  TempSrc: Oral  SpO2: 93%   Physical Exam Vitals signs reviewed.   Visit was started in person and finished by phone due to pt's daughter being worried about risk of covid infection   Labs reviewed: Basic Metabolic Panel: No results for input(s): NA, K, CL, CO2, GLUCOSE, BUN, CREATININE, CALCIUM, MG, PHOS, TSH in the last 8760 hours. Liver Function Tests: No results for input(s): AST, ALT, ALKPHOS, BILITOT, PROT, ALBUMIN in the last 8760 hours. No results for input(s): LIPASE, AMYLASE in the last 8760 hours. No results for input(s): AMMONIA in the last 8760 hours. CBC: Recent Labs    12/14/18 1539  WBC 5.3  NEUTROABS 2,237  HGB 13.6  HCT 39.9  MCV 89.3  PLT 230   Lipid Panel: No results for input(s): CHOL, HDL, LDLCALC, TRIG, CHOLHDL, LDLDIRECT in the last 8760 hours. Lab Results  Component Value Date   HGBA1C 5.5 04/12/2017    Assessment/Plan 1. Encounter for therapeutic drug monitoring - INR today subtherapeutic at 1.6 - goal 2-3 -one dose of 4mg  reportedly missed when pt's daughter did not watch her swallow the pill--she will make sure that does not happen again - POC INR--recheck in 4 wks  2. Chronic deep vein thrombosis (DVT) of distal vein of lower extremity, unspecified laterality (HCC) -reason for her coumadin therapy -pt would ideally be on an alternative therapy due to their difficulty getting INRs checked consistently  3. Mitral valve insufficiency, unspecified etiology -her daughter would like me to listen to her on Monday to make sure nothing new is happening b/c of this and her cough  4. Cough -pt having cough intermittently and daughter concerned it may be chf related  Labs/tests ordered:   Orders Placed This Encounter  Procedures  . POC INR    Next appt:  05/01/2019 to listen to lungs   Brevan Luberto L. Valine Drozdowski, D.O. Geriatrics [employer]  [facility] [address], [place] [address] Cell Phone (Mon-Fri 8am-5pm):  [phone] On Call:  [phone] & follow prompts after 5pm & weekends Office Phone:  [phone] Office Fax:  [phone]

## 2019-05-01 ENCOUNTER — Ambulatory Visit: Payer: Federal, State, Local not specified - PPO | Admitting: Internal Medicine

## 2019-05-11 ENCOUNTER — Other Ambulatory Visit: Payer: Self-pay | Admitting: *Deleted

## 2019-05-11 MED ORDER — NAMENDA 10 MG PO TABS: ORAL_TABLET | ORAL | 1 refills | Status: DC

## 2019-05-11 NOTE — Telephone Encounter (Signed)
Patient daughter requested rx. To be sent to [facility]. Faxed.

## 2019-05-25 ENCOUNTER — Other Ambulatory Visit: Payer: Self-pay | Admitting: Internal Medicine

## 2019-05-31 ENCOUNTER — Ambulatory Visit: Payer: Federal, State, Local not specified - PPO | Admitting: Family

## 2019-06-05 ENCOUNTER — Other Ambulatory Visit: Payer: Self-pay | Admitting: Internal Medicine

## 2019-06-05 ENCOUNTER — Ambulatory Visit: Payer: Self-pay | Admitting: Internal Medicine

## 2019-06-07 ENCOUNTER — Telehealth: Payer: Self-pay | Admitting: *Deleted

## 2019-06-07 NOTE — Telephone Encounter (Signed)
This is fine.  We can do this letter as written and print it out for me to sign at the time of her visit tomorrow.  Thanks.

## 2019-06-07 NOTE — Telephone Encounter (Signed)
Faith Ortiz, daughter called and stated that patient has an appointment tomorrow and they are needing a letter for her job stating:  To Whom It Faith Ortiz Concern, This note is regarding Faith Ortiz who is under my care. Faith Ortiz wants to reside in [place]. I recommend she relocate to [place] as soon as possible. This move will be beneficial to her health. Her caregivers (including daughter Faith Ortiz) and family support system are all available in [place]. In any questions or concerns please contact my office.

## 2019-06-08 ENCOUNTER — Encounter: Payer: Self-pay | Admitting: Internal Medicine

## 2019-06-08 ENCOUNTER — Other Ambulatory Visit: Payer: Self-pay

## 2019-06-08 ENCOUNTER — Ambulatory Visit (INDEPENDENT_AMBULATORY_CARE_PROVIDER_SITE_OTHER): Payer: Federal, State, Local not specified - PPO | Admitting: Internal Medicine

## 2019-06-08 VITALS — HR 68 | Temp 98.3°F

## 2019-06-08 DIAGNOSIS — Z5181 Encounter for therapeutic drug level monitoring: Secondary | ICD-10-CM | POA: Diagnosis not present

## 2019-06-08 LAB — POCT INR: INR: 2.1 (ref 2.0–3.0)

## 2019-06-08 NOTE — Telephone Encounter (Signed)
Letter printed.

## 2019-06-08 NOTE — Progress Notes (Signed)
[facility] Provider:  Jettson Crable L. Renato Gailseed, D.O., C.M.D.  Code Status: DNR Goals of Care:  Advanced Directives 12/13/2018  Does Patient Have a Medical Advance Directive? No  Type of Advance Directive -  Copy of Healthcare Power of Attorney in Chart? -  Would patient like information on creating a medical advance directive? No - Patient declined   Chief Complaint  Patient presents with  . Anticoagulation    PT/INR    HPI: Patient is a 85 y.o. female seen today in her son-in-law's Faith Ortiz for medical management of chronic diseases. She is doing well.  She had her INR done with goal 2-3 for DVT.  She is moving to [place] so this is her last visit here.  Her daughter will be taking care of her.     Past Medical History:  Diagnosis Date  . Alzheimer disease (HCC)   . Cataract 2011-2013   left  . Depression   . Mitral regurgitation   . TIA (transient ischemic attack)     Past Surgical History:  Procedure Laterality Date  . ABDOMINAL HYSTERECTOMY    . APPENDECTOMY    . BLADDER REPAIR    . CESAREAN SECTION    . CESAREAN SECTION    . CESAREAN SECTION    . TONSILLECTOMY      Allergies  Allergen Reactions  . Codeine Other (See Comments)    unknown  . Contrast Media [Iodinated Diagnostic Agents] Other (See Comments)    Weakness,   . Darvon [Propoxyphene] Other (See Comments)    Weakness, Fainting  . Wine [Alcohol] Other (See Comments)    Dizzy   . Penicillins Rash    Outpatient Encounter Medications as of 06/08/2019  Medication Sig  . Calcium Citrate-Vitamin D (CALCIUM + D PO) Take 1 tablet by mouth every morning.  . CYMBALTA 60 MG capsule TAKE 1 CAPSULE DAILY  . meclizine (ANTIVERT) 12.5 MG tablet Take 1 tablet (12.5 mg total) by mouth 3 (three) times daily as needed for dizziness.  Marland Kitchen. NAMENDA 10 MG tablet Take one tablet by mouth twice daily for memory BRAND NAME ONLY!!  . potassium chloride (KLOR-CON) 8 MEQ tablet TAKE 1 TABLET BY MOUTH EVERY DAY  .  simvastatin (ZOCOR) 10 MG tablet TAKE 1 TABLET BY MOUTH EVERY DAY  . traZODone (DESYREL) 150 MG tablet TAKE 1 TABLET (150 MG TOTAL) BY MOUTH AT BEDTIME.  Marland Kitchen. warfarin (COUMADIN) 3 MG tablet TAKE 1 TABLET BY MOUTH ON MONDAY,WEDNESDAY , FRIDAY AND SUNDAY  . warfarin (COUMADIN) 4 MG tablet TAKE 1 TABLET BY MOUTH ON TUESDAY, THURSDAY AND SATURDAY   No facility-administered encounter medications on file as of 06/08/2019.     Review of Systems:  Review of Systems  Constitutional: Negative for chills and fever.  HENT: Negative for congestion and sore throat.   Respiratory: Negative for cough and shortness of breath.   Cardiovascular: Negative for chest pain.  Genitourinary: Negative for dysuria.  Musculoskeletal: Negative for falls.  Neurological: Negative for dizziness and loss of consciousness.  Psychiatric/Behavioral: Positive for memory loss.    Health Maintenance  Topic Date Due  . INFLUENZA VACCINE  07/01/2019  . TETANUS/TDAP  08/09/2019  . DEXA SCAN  Completed  . PNA vac Low Risk Adult  Completed    Physical Exam: Vitals:   06/08/19 1530  Pulse: 68  Temp: 98.3 F (36.8 C)  TempSrc: Oral  SpO2: 95%   There is no height or weight on file to calculate BMI. Physical  Exam Vitals signs reviewed.  Constitutional:      General: She is not in acute distress.    Appearance: Normal appearance. She is not toxic-appearing.  HENT:     Head: Normocephalic and atraumatic.  Cardiovascular:     Rate and Rhythm: Normal rate and regular rhythm.     Heart sounds: Murmur present.  Pulmonary:     Effort: Pulmonary effort is normal.     Breath sounds: Normal breath sounds.  Skin:    General: Skin is warm and dry.  Neurological:     General: No focal deficit present.     Mental Status: She is alert. Mental status is at baseline.     Labs reviewed: Basic Metabolic Panel: No results for input(s): NA, K, CL, CO2, GLUCOSE, BUN, CREATININE, CALCIUM, MG, PHOS, TSH in the last 8760 hours.  Liver Function Tests: No results for input(s): AST, ALT, ALKPHOS, BILITOT, PROT, ALBUMIN in the last 8760 hours. No results for input(s): LIPASE, AMYLASE in the last 8760 hours. No results for input(s): AMMONIA in the last 8760 hours. CBC: Recent Labs    12/14/18 1539  WBC 5.3  NEUTROABS 2,237  HGB 13.6  HCT 39.9  MCV 89.3  PLT 230   Lipid Panel: No results for input(s): CHOL, HDL, LDLCALC, TRIG, CHOLHDL, LDLDIRECT in the last 8760 hours. Lab Results  Component Value Date   HGBA1C 5.5 04/12/2017    Procedures since last visit: No results found.  Assessment/Plan 1. Encounter for therapeutic drug monitoring - POC INR 2.1 today with goal 2-3 for recurrent DVT -cont same dose of coumadin and recheck INR in [place] in 4 weeks since she is moving there -letter provided indicating need for patient to have her daughter to care for her in [place]  Labs/tests ordered:  INR in 4 wks in [place] Next appt:  No further appts--pt moving  Troyce Gieske L. Ilya Neely, D.O. [facility] [address], [place] [address] Cell Phone (Mon-Fri 8am-5pm):  [phone] On Call:  [phone] & follow prompts after 5pm & weekends Office Phone:  [phone] Office Fax:  [phone]

## 2019-06-08 NOTE — Patient Instructions (Addendum)
Please get your INR checked next in 4 weeks in [place]. Best Wishes in [place]!

## 2019-06-09 ENCOUNTER — Other Ambulatory Visit: Payer: Self-pay | Admitting: Internal Medicine

## 2019-06-14 ENCOUNTER — Telehealth: Payer: Self-pay | Admitting: *Deleted

## 2019-06-14 NOTE — Telephone Encounter (Signed)
Faith Ortiz, daughter called and stated that the letter written was great but her job is requesting 2 more sentences to be added and daughter wants to pick up tomorrow.   Needs to ALSO state: Medical reason why Vermell should be in [place] is that she has a serious medical condition that will not improve on its on. She can't live independently on her on. Her quality of life should improve being surrounded by her husband, son, grandchildren and other family members who also resides in [place] and [place]. Only two family members reside in [place].

## 2019-06-14 NOTE — Telephone Encounter (Signed)
Patient daughter called back and stated that her job called her and told her that the medical diagnosis has to be specific. Daughter stated that that is fine with her you can add :   Medical reason why Azizah should be in [place] is that she has a Serious Medical Condition (Alzheimer's Stage 3 for 20 years) that will not improve on its on. She can't live independently on her on. Her quality of life should improve being surrounded by her husband, son, grandchildren and other family members who also resides in [place] and [place]. Only two family members reside in [place].

## 2019-06-15 NOTE — Telephone Encounter (Signed)
Spoke with daughter and advised results, letter ready for pickup.

## 2019-06-27 ENCOUNTER — Other Ambulatory Visit: Payer: Self-pay | Admitting: Internal Medicine

## 2019-07-13 ENCOUNTER — Other Ambulatory Visit: Payer: Self-pay | Admitting: Internal Medicine

## 2019-07-14 ENCOUNTER — Other Ambulatory Visit: Payer: Self-pay | Admitting: Internal Medicine

## 2019-07-14 NOTE — Telephone Encounter (Signed)
Called and spoke to patient to verify if patient had established care. Last office visit on 06/08/2019 state patient would be moving. Patient stated she was not moving and denied having conversation with Dr. Mariea Clonts. Stated she has a home there she goes to occasionally. Still wants to get all medications filled. Routing to provider for further review.

## 2019-07-17 ENCOUNTER — Telehealth: Payer: Self-pay | Admitting: *Deleted

## 2019-07-17 ENCOUNTER — Other Ambulatory Visit: Payer: Self-pay | Admitting: *Deleted

## 2019-07-17 MED ORDER — CYMBALTA 60 MG PO CPEP: 60.0000 mg | ORAL_CAPSULE | Freq: Every day | ORAL | 1 refills | Status: DC

## 2019-07-17 NOTE — Telephone Encounter (Signed)
Daughter called stating that pt's refills have been denied. I advised to daughter we thought they had moved? Per daughter she asked for a letter in July stating they where moving to [place] and we took Dr. Mariea Clonts name off pt's chart. Now daughter is calling stating THAT'S NOT CORRECT, they are still in [place]. Please advise, also pt does not have any future appts with Dr. Mariea Clonts.

## 2019-07-18 NOTE — Telephone Encounter (Signed)
That was exactly what we were told.  Please clarify again and if they are staying, she needs her regular INR checks scheduled and we can renew her medications.    (I'm not understanding why they needed the letter if they're not actually moving.)

## 2019-07-20 NOTE — Telephone Encounter (Signed)
.  left message to have patient return my call.  

## 2019-09-20 ENCOUNTER — Other Ambulatory Visit: Payer: Self-pay

## 2019-09-20 DIAGNOSIS — Z5181 Encounter for therapeutic drug level monitoring: Secondary | ICD-10-CM

## 2019-09-20 DIAGNOSIS — I825Z9 Chronic embolism and thrombosis of unspecified deep veins of unspecified distal lower extremity: Secondary | ICD-10-CM

## 2019-09-20 DIAGNOSIS — Z7901 Long term (current) use of anticoagulants: Secondary | ICD-10-CM

## 2019-09-21 ENCOUNTER — Telehealth: Payer: Self-pay

## 2019-09-21 ENCOUNTER — Other Ambulatory Visit: Payer: Self-pay

## 2019-09-21 NOTE — Telephone Encounter (Signed)
Message left on clinical intake voicemail: Patients daughter called demanding that someone come to her car to do a PT/INR today for she will not bring her into this building for a blood draw. Patients daughter is concerned about covid   After speaking with management I called patients daughter and informed her that we are no longer providing services at the car. The process of having a visit outside/car was a temporary workaround due to the pandemic and is no longer in place. We (city [place]) are in phase 3 and patients have the option to come in office or do a virtual visit if covid/flu symptoms present.   Patients daughter was willing to schedule an appointment with Dr.Reed on Monday for a fingerstick and emphasized again that she is not going to the lab.   Lab appointment for today canceled

## 2019-09-25 ENCOUNTER — Other Ambulatory Visit: Payer: Self-pay

## 2019-09-25 ENCOUNTER — Ambulatory Visit (INDEPENDENT_AMBULATORY_CARE_PROVIDER_SITE_OTHER): Payer: Federal, State, Local not specified - PPO | Admitting: Internal Medicine

## 2019-09-25 ENCOUNTER — Encounter: Payer: Self-pay | Admitting: Internal Medicine

## 2019-09-25 VITALS — BP 118/66 | HR 85 | Temp 97.9°F | Ht 64.0 in | Wt 131.4 lb

## 2019-09-25 DIAGNOSIS — Z23 Encounter for immunization: Secondary | ICD-10-CM

## 2019-09-25 DIAGNOSIS — I825Z9 Chronic embolism and thrombosis of unspecified deep veins of unspecified distal lower extremity: Secondary | ICD-10-CM

## 2019-09-25 DIAGNOSIS — F5101 Primary insomnia: Secondary | ICD-10-CM

## 2019-09-25 DIAGNOSIS — F0280 Dementia in other diseases classified elsewhere without behavioral disturbance: Secondary | ICD-10-CM

## 2019-09-25 DIAGNOSIS — H6123 Impacted cerumen, bilateral: Secondary | ICD-10-CM

## 2019-09-25 DIAGNOSIS — Z66 Do not resuscitate: Secondary | ICD-10-CM

## 2019-09-25 DIAGNOSIS — E785 Hyperlipidemia, unspecified: Secondary | ICD-10-CM

## 2019-09-25 DIAGNOSIS — I340 Nonrheumatic mitral (valve) insufficiency: Secondary | ICD-10-CM

## 2019-09-25 DIAGNOSIS — Z7901 Long term (current) use of anticoagulants: Secondary | ICD-10-CM | POA: Diagnosis not present

## 2019-09-25 DIAGNOSIS — G301 Alzheimer's disease with late onset: Secondary | ICD-10-CM

## 2019-09-25 LAB — POCT INR: INR: 2.2 (ref 2.0–3.0)

## 2019-09-25 MED ORDER — TRAZODONE HCL 100 MG PO TABS: 100.0000 mg | ORAL_TABLET | Freq: Every day | ORAL | 3 refills | Status: DC

## 2019-09-25 MED ORDER — TETANUS-DIPHTH-ACELL PERTUSSIS 5-2.5-18.5 LF-MCG/0.5 IM SUSP: 0.5000 mL | Freq: Once | INTRAMUSCULAR | 0 refills | Status: AC

## 2019-09-25 NOTE — Progress Notes (Signed)
[facility] Provider:  Chaelyn Bunyan L. Renato Gailseed, D.O., C.M.D.  Code Status: DNR Goals of Care:  Advanced Directives 12/13/2018  Does Patient Have a Medical Advance Directive? No  Type of Advance Directive -  Copy of Healthcare Power of Attorney in Chart? -  Would patient like information on creating a medical advance directive? No - Patient declined     Chief Complaint  Patient presents with  . Medical Management of Chronic Issues    Patient needs INR  . Immunizations    Patient needs flu shot - received today.  Tetanus sent to pharmacy.    HPI: Patient is a 85 y.o. female seen today for medical management of chronic diseases.  She is here for an INR.  She should have had an INR In August, Sept, and earlier this month.  [place] does sometimes travel to [place] [employer]  The last time I saw her, her daughter said they were moving permanently and had requested a letter reading as such.  Then suddenly they resurfaced this month for an INR check.    INR today is excellent at 2.2.  She's on anticoagulation for chronic DVT so goal INR 2-3.  She's had a prior TIA and stroke.   Trazodone is knocking her out right away now.   Appetite is getting worse.   They are thinking about quarantining in [place]. She stays in her room and watches tv more amid covid.  Discussed regular exercise with walking.   Past Medical History:  Diagnosis Date  . Alzheimer disease (HCC)   . Cataract 2011-2013   left  . Depression   . Mitral regurgitation   . TIA (transient ischemic attack)     Past Surgical History:  Procedure Laterality Date  . ABDOMINAL HYSTERECTOMY    . APPENDECTOMY    . BLADDER REPAIR    . CESAREAN SECTION    . CESAREAN SECTION    . CESAREAN SECTION    . TONSILLECTOMY      Allergies  Allergen Reactions  . Codeine Other (See Comments)    unknown  . Contrast Media [Iodinated Diagnostic Agents] Other (See Comments)    Weakness,   . Darvon [Propoxyphene] Other (See  Comments)    Weakness, Fainting  . Wine [Alcohol] Other (See Comments)    Dizzy   . Penicillins Rash    Outpatient Encounter Medications as of 09/25/2019  Medication Sig  . Calcium Citrate-Vitamin D (CALCIUM + D PO) Take 1 tablet by mouth every morning.  . CYMBALTA 60 MG capsule Take 1 capsule (60 mg total) by mouth daily.  Marland Kitchen. NAMENDA 10 MG tablet Take one tablet by mouth twice daily for memory BRAND NAME ONLY!!  . potassium chloride (KLOR-CON) 8 MEQ tablet TAKE 1 TABLET BY MOUTH EVERY DAY  . simvastatin (ZOCOR) 10 MG tablet TAKE 1 TABLET BY MOUTH EVERY DAY  . tetanus & diphtheria toxoids, adult, (TENIVAC) 5-2 LFU injection Inject 0.5 mLs into the muscle once.  . traZODone (DESYREL) 150 MG tablet TAKE 1 TABLET (150 MG TOTAL) BY MOUTH AT BEDTIME.  Marland Kitchen. warfarin (COUMADIN) 3 MG tablet TAKE 1 TABLET BY MOUTH ON MONDAY,WEDNESDAY , FRIDAY AND SUNDAY  . warfarin (COUMADIN) 4 MG tablet TAKE 1 TABLET BY MOUTH ON TUESDAY, THURSDAY AND SATURDAY  . [DISCONTINUED] meclizine (ANTIVERT) 12.5 MG tablet Take 1 tablet (12.5 mg total) by mouth 3 (three) times daily as needed for dizziness.   No facility-administered encounter medications on file as of 09/25/2019.  Review of Systems:  Review of Systems  Constitutional: Positive for malaise/fatigue. Negative for chills and fever.  HENT: Negative for congestion and hearing loss.        Cerumen impaction  Eyes: Negative for blurred vision.  Respiratory: Negative for cough and shortness of breath.   Cardiovascular: Negative for chest pain, palpitations and leg swelling.  Gastrointestinal: Negative for abdominal pain, blood in stool, constipation, diarrhea, heartburn, melena, nausea and vomiting.  Genitourinary: Negative for dysuria.  Musculoskeletal: Negative for back pain, falls, joint pain, myalgias and neck pain.  Skin: Negative for rash.       Dry skin  Neurological: Negative for dizziness, sensory change and loss of consciousness.    Endo/Heme/Allergies: Does not bruise/bleed easily.  Psychiatric/Behavioral: Positive for memory loss. Negative for depression. The patient does not have insomnia.     Health Maintenance  Topic Date Due  . INFLUENZA VACCINE  07/01/2019  . TETANUS/TDAP  08/09/2019  . DEXA SCAN  Completed  . PNA vac Low Risk Adult  Completed    Physical Exam: Vitals:   09/25/19 1332  BP: 118/66  Pulse: 85  Temp: 97.9 F (36.6 C)  TempSrc: Oral  SpO2: 96%  Weight: 131 lb 6.4 oz (59.6 kg)  Height: 5\' 4"  (1.626 m)   Body mass index is 22.55 kg/m. Physical Exam Vitals signs reviewed.  Constitutional:      General: She is not in acute distress.    Appearance: Normal appearance. She is not ill-appearing or toxic-appearing.  HENT:     Head: Normocephalic and atraumatic.     Right Ear: There is impacted cerumen.     Left Ear: There is impacted cerumen.  Neck:     Musculoskeletal: Neck supple. No muscular tenderness.  Cardiovascular:     Rate and Rhythm: Rhythm irregular.     Heart sounds: Murmur present.  Pulmonary:     Effort: Pulmonary effort is normal.     Breath sounds: Normal breath sounds. No wheezing, rhonchi or rales.  Abdominal:     General: Bowel sounds are normal. There is no distension.     Palpations: Abdomen is soft.  Musculoskeletal: Normal range of motion.     Right lower leg: No edema.     Left lower leg: No edema.  Lymphadenopathy:     Cervical: No cervical adenopathy.  Skin:    General: Skin is warm and dry.     Capillary Refill: Capillary refill takes less than 2 seconds.     Coloration: Skin is pale.  Neurological:     General: No focal deficit present.     Mental Status: She is alert. Mental status is at baseline.     Motor: No weakness.     Gait: Gait normal.     Labs reviewed: CBC: Recent Labs    12/14/18 1539  WBC 5.3  NEUTROABS 2,237  HGB 13.6  HCT 39.9  MCV 89.3  PLT 230    Lab Results  Component Value Date   HGBA1C 5.5 04/12/2017     Assessment/Plan 1. Chronic deep vein thrombosis (DVT) of distal vein of lower extremity, unspecified laterality (HCC) - continues on warfarin therapy for this with INR goal 2-3--was 2.2 though last one I have on file was July here -discussed NOACs again today due to irregular visits for INRs putting her at risk of bleeding or recurrent clotting and the fact that they want to avoid visits especially amid covid; however, her daughter does not sound like she's going  to go for the change to eliquis that I recommended - CBC with Differential/Platelet  2. Current use of long term anticoagulation - continue warfarin - POC INR in 4 wks  3. Late onset Alzheimer's disease without behavioral disturbance (HCC) -gradually progressive, may also have some vascular component given prior TIA, stroke -cont namenda therapy  4. Mitral valve insufficiency, unspecified etiology -asymptomatic, persistent murmur  5. Hyperlipidemia, unspecified hyperlipidemia type -continues on zocor therapy for this -not fasting at this time of day so lipids could not be checked - Hepatic function panel - Basic metabolic panel  6. Primary insomnia - pt now responding to trazodone much easier and sleeping like a rock--will reduce from 150mg  to 100mg  and they will let me know how that's doing - traZODone (DESYREL) 100 MG tablet; Take 1 tablet (100 mg total) by mouth at bedtime.  Dispense: 90 tablet; Refill: 3  7. Bilateral impacted cerumen -recommended flushing but they were eager to leave due to covid concerns so I discussed debrox drops at hs to loosen wax  8. Need for influenza vaccination - Flu Vaccine QUAD High Dose(Fluad) given  9. Need for Tdap vaccination - Tdap (BOOSTRIX) 5-2.5-18.5 LF-MCG/0.5 injection; Inject 0.5 mLs into the muscle once for 1 dose.  Dispense: 0.5 mL; Refill: 0 will get at [facility]  Labs/tests ordered:   Lab Orders     CBC with Differential/Platelet     Hepatic  function panel     Basic metabolic panel     POC INR  Next appt:  4 wks for INR check in lab  Tammara Massing L. Dennis Hegeman, D.O. [facility] [address], [address] [place] Cell Phone (Mon-Fri 8am-5pm):  [phone] On Call:  [phone] & follow prompts after 5pm & weekends Office Phone:  [phone] Office Fax:  [phone]

## 2019-09-25 NOTE — Patient Instructions (Signed)
Please call me back if you decide to switch to eliquis therapy and to come off warfarin/coumadin.   Otherwise, please return for your PT/INR in the lab in 4 weeks.

## 2019-09-26 LAB — BASIC METABOLIC PANEL
BUN/Creatinine Ratio: 34 (calc) — ABNORMAL HIGH (ref 6–22)
BUN: 29 mg/dL — ABNORMAL HIGH (ref 7–25)
CO2: 25 mmol/L (ref 20–32)
Calcium: 9.4 mg/dL (ref 8.6–10.4)
Chloride: 105 mmol/L (ref 98–110)
Creat: 0.85 mg/dL (ref 0.60–0.88)
Glucose, Bld: 163 mg/dL — ABNORMAL HIGH (ref 65–139)
Potassium: 4.1 mmol/L (ref 3.5–5.3)
Sodium: 140 mmol/L (ref 135–146)

## 2019-09-26 LAB — HEPATIC FUNCTION PANEL
AG Ratio: 1.3 (calc) (ref 1.0–2.5)
ALT: 17 U/L (ref 6–29)
AST: 24 U/L (ref 10–35)
Albumin: 3.9 g/dL (ref 3.6–5.1)
Alkaline phosphatase (APISO): 59 U/L (ref 37–153)
Bilirubin, Direct: 0.1 mg/dL (ref 0.0–0.2)
Globulin: 3.1 g/dL (calc) (ref 1.9–3.7)
Indirect Bilirubin: 0.4 mg/dL (calc) (ref 0.2–1.2)
Total Bilirubin: 0.5 mg/dL (ref 0.2–1.2)
Total Protein: 7 g/dL (ref 6.1–8.1)

## 2019-09-26 LAB — CBC WITH DIFFERENTIAL/PLATELET
Absolute Monocytes: 280 cells/uL (ref 200–950)
Basophils Absolute: 22 cells/uL (ref 0–200)
Basophils Relative: 0.5 %
Eosinophils Absolute: 39 cells/uL (ref 15–500)
Eosinophils Relative: 0.9 %
HCT: 44.5 % (ref 35.0–45.0)
Hemoglobin: 14.5 g/dL (ref 11.7–15.5)
Lymphs Abs: 1634 cells/uL (ref 850–3900)
MCH: 29.4 pg (ref 27.0–33.0)
MCHC: 32.6 g/dL (ref 32.0–36.0)
MCV: 90.1 fL (ref 80.0–100.0)
MPV: 11.1 fL (ref 7.5–12.5)
Monocytes Relative: 6.5 %
Neutro Abs: 2326 cells/uL (ref 1500–7800)
Neutrophils Relative %: 54.1 %
Platelets: 237 10*3/uL (ref 140–400)
RBC: 4.94 10*6/uL (ref 3.80–5.10)
RDW: 12.5 % (ref 11.0–15.0)
Total Lymphocyte: 38 %
WBC: 4.3 10*3/uL (ref 3.8–10.8)

## 2019-09-27 ENCOUNTER — Telehealth: Payer: Self-pay

## 2019-09-27 NOTE — Telephone Encounter (Signed)
Called daughter, Alicyn Klann, with recommendation.

## 2019-09-27 NOTE — Telephone Encounter (Addendum)
The daughter is calling and says her mother's arm is sore from the flu shot she received on Monday.  There is no redness or swelling.  She wants to know what OTC medication her mother can take.    Jessica recommended Tylenol.  No further recommendations needed at this time.

## 2019-09-27 NOTE — Telephone Encounter (Signed)
She had her flu shot Monday and she is having some pain at the injection site, no redness or swelling.  Daughter, Nawal Burling, has called 3 times wanting to know what her mother can take for her discomfort.  Please advise.

## 2019-09-27 NOTE — Telephone Encounter (Signed)
Would recommended tylenol 325mg  2 tablets every 6 hours as needed for pain

## 2019-09-27 NOTE — Telephone Encounter (Signed)
This encounter was created in error - please disregard.

## 2019-10-07 ENCOUNTER — Other Ambulatory Visit: Payer: Self-pay | Admitting: Internal Medicine

## 2019-10-09 ENCOUNTER — Other Ambulatory Visit: Payer: Self-pay | Admitting: Internal Medicine

## 2019-10-09 ENCOUNTER — Telehealth: Payer: Self-pay

## 2019-10-09 NOTE — Telephone Encounter (Signed)
She requires INRs every 4 weeks for proper coumadin management.  If they would like, we can change her to eliquis as I recommended and then these frequent visits will not be needed, just twice a year labs and visits.

## 2019-10-09 NOTE — Telephone Encounter (Signed)
Called patient and spoke to patient's daughter. She states right now they do not want to bring patient in office. They know she is due to being seen but they are afraid. Daughter states she is open to scheduling an appointment for patient for another 4 weeks. Tried to schedule appointment for patient but was told just schedule same day and time. Daughter stated she had another phone call and needed to go.

## 2019-10-09 NOTE — Telephone Encounter (Signed)
Thank you.  Yes, they need to schedule the appt, please.

## 2019-10-09 NOTE — Telephone Encounter (Signed)
Patient has no upcoming appointment. Reviewed last visit and patient should return around 10/23/2019 for 4 week follow up appointment, but no appointment was scheduled. Tried calling patient's daughter but no answer and unable to leave message. Routing to provider for approval.

## 2019-10-11 NOTE — Telephone Encounter (Signed)
Tried calling patient's daughter back to discuss providers recommendations. No answer. LMOM for someone to call office.

## 2019-10-11 NOTE — Telephone Encounter (Signed)
Patient's daughter called back and stated that she appreciated provider's concern but has been keeping inrs controled for the past year. States patient is currently in [place] and she is working on having her  her INR checked and sending [place] the paperwork. Daughter appreciates your advise to switch to eliquis at this time but refused. Stated that she would like to keep patient on current regimen at this time. Routing to provider.

## 2019-10-18 ENCOUNTER — Other Ambulatory Visit: Payer: Self-pay

## 2019-10-18 ENCOUNTER — Ambulatory Visit (INDEPENDENT_AMBULATORY_CARE_PROVIDER_SITE_OTHER): Payer: Federal, State, Local not specified - PPO | Admitting: Family

## 2019-10-18 ENCOUNTER — Encounter: Payer: Self-pay | Admitting: Family

## 2019-10-18 VITALS — BP 112/68 | HR 81 | Temp 97.3°F | Ht 64.0 in | Wt 128.0 lb

## 2019-10-18 DIAGNOSIS — B029 Zoster without complications: Secondary | ICD-10-CM

## 2019-10-18 MED ORDER — VALACYCLOVIR HCL 1 G PO TABS: 1000.0000 mg | ORAL_TABLET | Freq: Two times a day (BID) | ORAL | 0 refills | Status: DC

## 2019-10-18 MED ORDER — CALAMINE EX LOTN: 1.0000 "application " | TOPICAL_LOTION | Freq: Two times a day (BID) | CUTANEOUS | 0 refills | Status: DC

## 2019-10-18 NOTE — Progress Notes (Addendum)
Provider: Chance Munter FNP-C  Gayland Curry, DO  Patient Care Team: Gayland Curry, DO as PCP - General (Geriatric Medicine)  Extended Emergency Contact Information Primary Emergency Contact: Clinton,Kashlyn  [place] Mobile Phone: [phone] Relation: None  Code Status:  DNR Goals of care: Advanced Directive information Advanced Directives 12/13/2018  Does Patient Have a Medical Advance Directive? No  Type of Advance Directive -  Copy of [place] in Chart? -  Would patient like information on creating a medical advance directive? No - Patient declined     Chief Complaint  Patient presents with  . Acute Visit    Patient's daughter states shingles are on patient's back and chest and started yesterday   . Medication Management    patient is using tyelnol for the pain and would like stronger pain meds     HPI:  Pt is a 85 y.o. female seen today for an acute visit for evaluation of painful rash on the back and chest.she is here with her daughter who states noticed rash last night and called on call provider Sherrie Mustache NP who recommended office visit for evaluation.she does know how long she has had the rash.she thought the pillow was pressing on her but daughter noticed rash.she denies any fever or chills.she states has never had a shingles vaccine.No one else has a rash at home.No changes of lotion,soap or detergent.    Past Medical History:  Diagnosis Date  . Alzheimer disease ([place])   . Cataract 2011-2013   left  . Depression   . Mitral regurgitation   . TIA (transient ischemic attack)    Past Surgical History:  Procedure Laterality Date  . ABDOMINAL HYSTERECTOMY    . APPENDECTOMY    . BLADDER REPAIR    . CESAREAN SECTION    . CESAREAN SECTION    . CESAREAN SECTION    . TONSILLECTOMY      Allergies  Allergen Reactions  . Codeine Other (See Comments)    unknown  . Contrast Media [Iodinated Diagnostic Agents] Other  (See Comments)    Weakness,   . Darvon [Propoxyphene] Other (See Comments)    Weakness, Fainting  . Wine [Alcohol] Other (See Comments)    Dizzy   . Penicillins Rash    Outpatient Encounter Medications as of 10/18/2019  Medication Sig  . Calcium Citrate-Vitamin D (CALCIUM + D PO) Take 1 tablet by mouth every morning.  . CYMBALTA 60 MG capsule Take 1 capsule (60 mg total) by mouth daily.  Marland Kitchen NAMENDA 10 MG tablet TAKE 1 TABLET TWICE A DAY  FOR MEMORY  . potassium chloride (KLOR-CON) 8 MEQ tablet TAKE 1 TABLET BY MOUTH EVERY DAY  . simvastatin (ZOCOR) 10 MG tablet TAKE 1 TABLET BY MOUTH EVERY DAY  . tetanus & diphtheria toxoids, adult, (TENIVAC) 5-2 LFU injection Inject 0.5 mLs into the muscle once.  . traZODone (DESYREL) 100 MG tablet Take 1 tablet (100 mg total) by mouth at bedtime.  Marland Kitchen warfarin (COUMADIN) 3 MG tablet TAKE 1 TABLET BY MOUTH ON MONDAY,WEDNESDAY , FRIDAY AND SUNDAY  . warfarin (COUMADIN) 4 MG tablet TAKE 1 TABLET BY MOUTH ON TUESDAY, THURSDAY AND SATURDAY   No facility-administered encounter medications on file as of 10/18/2019.     Review of Systems  Reason unable to perform ROS: daughter provides additional information   Constitutional: Positive for appetite change. Negative for chills, fatigue and fever.       Chronic poor appetite per  daughter   Eyes: Negative for discharge, redness and itching.  Respiratory: Negative for cough, chest tightness, shortness of breath and wheezing.   Cardiovascular: Negative for chest pain, palpitations and leg swelling.  Gastrointestinal: Negative for diarrhea, nausea and vomiting.  Skin: Positive for rash. Negative for color change and pallor.       Rash on back and chest   Neurological: Negative for dizziness, light-headedness, numbness and headaches.  Psychiatric/Behavioral: Negative for agitation. The patient is not nervous/anxious.     Immunization History  Administered Date(s) Administered  . Fluad Quad(high Dose 65+)  09/25/2019  . Hepatitis B 03/22/2003  . Influenza, High Dose Seasonal PF 10/02/2017, 09/17/2018  . Influenza,inj,Quad PF,6+ Mos 08/19/2015, 08/24/2016  . Influenza-Unspecified 09/27/2014  . PPD Test 03/22/2003  . Pneumococcal Conjugate-13 10/02/2014  . Pneumococcal Polysaccharide-23 08/02/2007  . Td 06/05/2000  . Tdap 08/08/2009, 09/25/2019  . Zoster 02/27/2015   Pertinent  Health Maintenance Due  Topic Date Due  . INFLUENZA VACCINE  Completed  . DEXA SCAN  Completed  . PNA vac Low Risk Adult  Completed   Fall Risk  09/25/2019 04/27/2019 03/27/2019 12/13/2018 02/28/2018  Falls in the past year? 1 0 0 0 No  Number falls in past yr: 0 0 0 0 -  Injury with Fall? 0 0 0 0 -    Vitals:   10/18/19 1437  BP: 112/68  Pulse: 81  Temp: (!) 97.3 F (36.3 C)  TempSrc: Temporal  SpO2: 97%  Weight: 128 lb (58.1 kg)  Height: 5\' 4"  (1.626 m)   Body mass index is 21.97 kg/m. Physical Exam Vitals signs reviewed.  Constitutional:      General: She is not in acute distress.    Appearance: She is normal weight. She is not ill-appearing.  HENT:     Head: Normocephalic.     Nose: Nose normal. No congestion or rhinorrhea.     Mouth/Throat:     Mouth: Mucous membranes are moist.     Pharynx: Oropharynx is clear. No oropharyngeal exudate or posterior oropharyngeal erythema.  Eyes:     General: No scleral icterus.       Right eye: No discharge.        Left eye: No discharge.     Extraocular Movements: Extraocular movements intact.     Conjunctiva/sclera: Conjunctivae normal.     Pupils: Pupils are equal, round, and reactive to light.  Cardiovascular:     Rate and Rhythm: Normal rate. Rhythm irregular.     Pulses: Normal pulses.     Heart sounds: Normal heart sounds. No murmur. No friction rub. No gallop.   Pulmonary:     Effort: Pulmonary effort is normal. No respiratory distress.     Breath sounds: Normal breath sounds. No wheezing, rhonchi or rales.  Chest:     Chest wall: No  tenderness.  Skin:    General: Skin is warm and dry.     Coloration: Skin is not pale.     Findings: Rash present. No bruising. Rash is papular.          Comments: Papular rash on thoracic dermatone along right upper back,right posterior of upper arm and few papules on right breast on erythematous base.Rash covered with absorbent gauze dressing for protection and absorption if papules open.   Neurological:     Mental Status: She is alert. Mental status is at baseline.     Cranial Nerves: No cranial nerve deficit.     Motor: No weakness.  Psychiatric:        Mood and Affect: Mood normal.        Behavior: Behavior normal.        Thought Content: Thought content normal.        Judgment: Judgment normal.    Labs reviewed: Recent Labs    09/25/19 1426  NA 140  K 4.1  CL 105  CO2 25  GLUCOSE 163*  BUN 29*  CREATININE 0.85  CALCIUM 9.4   Recent Labs    09/25/19 1426  AST 24  ALT 17  BILITOT 0.5  PROT 7.0   Recent Labs    12/14/18 1539 09/25/19 1426  WBC 5.3 4.3  NEUTROABS 2,237 2,326  HGB 13.6 14.5  HCT 39.9 44.5  MCV 89.3 90.1  PLT 230 237   Lab Results  Component Value Date   TSH 2.310 03/12/2015   Lab Results  Component Value Date   HGBA1C 5.5 04/12/2017   Lab Results  Component Value Date   CHOL 217 (A) 12/12/2015   HDL 68 12/12/2015   LDLCALC 122 12/12/2015   TRIG 137 12/12/2015   CHOLHDL 3.1 03/12/2015    Significant Diagnostic Results in last 30 days:  No results found.  Assessment/Plan 1. Herpes zoster without complication Afebrile.Papular rash on thoracic dermatone along right upper back,right posterior of upper arm and few papules on right breast on erythematous base.Rash covered with absorbent gauze dressing for protection and absorption if papules open. Discussed with daughter and patient to cover affected areas with gauze or ABD pad for protection and absorption of drainage if vesicles open.she verbalized understanding. - valACYclovir  (VALTREX) 1000 MG tablet; Take 1 tablet (1,000 mg total) by mouth 2 (two) times daily for 7 days.  Dispense: 14 tablet; Refill: 0 - calamine lotion; Apply 1 application topically 2 (two) times daily. To affected area on the back and chest  Dispense: 120 mL; Refill: 0 - Encouraged to take extra Strength tylenol 1000 mg tablet in the morning and at bedtime and 500 mg tablet at 2 pm daily for pain.  - Notify provider if symptoms worsen or not resolved.   Family/ staff Communication: Reviewed plan of care with patient and daughter.   Labs/tests ordered: None   Next appointment: 10/25/2019 with Dr.Reed.   Caesar Bookman, NP

## 2019-10-29 ENCOUNTER — Other Ambulatory Visit: Payer: Self-pay | Admitting: Adult Health

## 2019-10-29 ENCOUNTER — Telehealth: Payer: Self-pay | Admitting: Adult Health

## 2019-10-29 DIAGNOSIS — B029 Zoster without complications: Secondary | ICD-10-CM

## 2019-10-29 MED ORDER — VALACYCLOVIR HCL 1 G PO TABS: 1000.0000 mg | ORAL_TABLET | Freq: Two times a day (BID) | ORAL | 0 refills | Status: AC

## 2019-10-30 ENCOUNTER — Telehealth: Payer: Self-pay

## 2019-10-30 NOTE — Telephone Encounter (Signed)
Called and spoke with patients daughter she says yes she has picked up the other prescription but her mother does better while taking the medication I told her you had sent the medication to pharmacy for her to pick up if this is not correct please let me know and the reason so I can inform her of this

## 2019-10-30 NOTE — Telephone Encounter (Signed)
I received message from On call provider Monina NP ordered additional three days of valacyclovir.Please verify if patient picked up medication.  Start on Gabapentin 100 mg tablet one by mouth three times daily for Neuropathic pain. If rash not resolved or not improved upon completion of additional valacyclovir will need follow up visit.

## 2019-10-30 NOTE — Telephone Encounter (Signed)
Patients daughter called to request three more days for antiviral medication because her mother seems to do better while taking it and also to ask for a nerve pill for the pain of the shingles virus Please Advise

## 2019-10-30 NOTE — Telephone Encounter (Signed)
Dinah, Would you please address since you did visit on patient?  Thanks.

## 2019-10-31 ENCOUNTER — Other Ambulatory Visit: Payer: Self-pay | Admitting: Family

## 2019-10-31 MED ORDER — GABAPENTIN 100 MG PO CAPS: 100.0000 mg | ORAL_CAPSULE | Freq: Three times a day (TID) | ORAL | 0 refills | Status: DC

## 2019-10-31 NOTE — Telephone Encounter (Signed)
Gabapentin 100 mg tablet one tablet three times daily prescription send to pharmacy.

## 2019-11-06 ENCOUNTER — Telehealth: Payer: Self-pay | Admitting: *Deleted

## 2019-11-06 MED ORDER — VALACYCLOVIR HCL 1 G PO TABS: 1000.0000 mg | ORAL_TABLET | Freq: Two times a day (BID) | ORAL | 0 refills | Status: DC

## 2019-11-06 NOTE — Telephone Encounter (Signed)
Patient daughter notified and medication sent to pharmacy.

## 2019-11-06 NOTE — Telephone Encounter (Signed)
Patient daughter called and stated that patient's shingles have not completely cleared up on her back (3 spots left). Daughter stated that patient needs 3 more days worth called to pharmacy and that should help clear it up.   Daughter stated that she IS NOT bringing patient into the office due to the [place]. Stated that she brought her in for a Flu shot and 10 days later got the shingles, so she questions if she even got the flu shot or the shingles shot.   Stated that the Gabapentin is working perfectly. Again stated she is not bringing patient into the office, just wants another round of medication sent to the pharmacy.   Please Advise.

## 2019-11-06 NOTE — Telephone Encounter (Signed)
May send 3 more days of valacyclovir.Of note,Patient was given Flu shot at the office.We don't administer shingles vaccine at the office shingles vaccine is given at the patient's pharmacy not the office.

## 2019-11-09 ENCOUNTER — Telehealth: Payer: Self-pay | Admitting: *Deleted

## 2019-11-09 NOTE — Telephone Encounter (Signed)
Will need to re-evaluate patient in the office.

## 2019-11-09 NOTE — Telephone Encounter (Signed)
Patient daughter, Iasia called and stated that patient still has one little red spot that has not completely gone away. Stated that she wants one more round of Valacyclovir called to pharmacy for 3 more days. Stated that she does not want to take no chances of it coming back. Stated that she IS NOT going to bring her into the office. Please Advise.

## 2019-11-09 NOTE — Telephone Encounter (Signed)
I need to reassess patient prior to prescribing more valacyclovir

## 2019-11-09 NOTE — Telephone Encounter (Signed)
Stated that "That's not going to happen" just wants the medication. Stated that they are not comfortable coming out. Stated that they are just talking about a rash. Stated that if you would have given her 6 days worth already she wouldn't have to be asking again. Stated If you don't want to give them to her she wants to speak with you directly. Stated that she wants the medication TODAY.  Please Advise.

## 2019-11-10 ENCOUNTER — Encounter: Payer: Self-pay | Admitting: Family

## 2019-11-10 ENCOUNTER — Other Ambulatory Visit: Payer: Self-pay

## 2019-11-10 ENCOUNTER — Ambulatory Visit (INDEPENDENT_AMBULATORY_CARE_PROVIDER_SITE_OTHER): Payer: Federal, State, Local not specified - PPO | Admitting: Family

## 2019-11-10 DIAGNOSIS — B029 Zoster without complications: Secondary | ICD-10-CM

## 2019-11-10 MED ORDER — VALACYCLOVIR HCL 1 G PO TABS: 1000.0000 mg | ORAL_TABLET | Freq: Two times a day (BID) | ORAL | 0 refills | Status: AC

## 2019-11-10 NOTE — Progress Notes (Signed)
This service is provided via telemedicine  No vital signs collected/recorded due to the encounter was a telemedicine visit.   Location of patient (ex: home, work):  Home   Patient consents to a telephone visit: Yes  Location of the provider (ex: office, home):  Office  Name of any referring provider:  Bufford Spikes, D. O   Names of all persons participating in the telemedicine service and their role in the encounter: Richarda Blade, NP, Asher Muir CMA, patient and daughter   Time spent on call:  Asher Muir CMA, spent 7 minutes on phone with patient.    Provider: Anasia Agro FNP-C  Kermit Balo, DO  Patient Care Team: Kermit Balo, DO as PCP - General (Geriatric Medicine)  Extended Emergency Contact Information Primary Emergency Contact: Ortiz,Faith  [place] Mobile Phone: [phone] Relation: None  Code Status:  DNR Goals of care: Advanced Directive information Advanced Directives 12/13/2018  Does Patient Have a Medical Advance Directive? No  Type of Advance Directive -  Copy of Healthcare Power of Attorney in Chart? -  Would patient like information on creating a medical advance directive? No - Patient declined     Chief Complaint  Patient presents with  . Acute Visit    Patient's daughter states shingles has not been resolved and needs more medication for shingle. Daughter states medication worked but without it the pain increases    HPI:  Pt is a 85 y.o. female seen today for an acute visit for follow up shingles infection. Patient's daughter states shingles has improved on the back except areas under patient's arm.Otherwise states medication worked but without it the pain increases.She states previous blisters have resolved no drainage.she takes Gabapentin which helps with the pain.she denies any fever or chills.   Past Medical History:  Diagnosis Date  . Alzheimer disease ([facility])   . Cataract 2011-2013   left  . Depression    . Mitral regurgitation   . TIA (transient ischemic attack)    Past Surgical History:  Procedure Laterality Date  . ABDOMINAL HYSTERECTOMY    . APPENDECTOMY    . BLADDER REPAIR    . CESAREAN SECTION    . CESAREAN SECTION    . CESAREAN SECTION    . TONSILLECTOMY      Allergies  Allergen Reactions  . Codeine Other (See Comments)    unknown  . Contrast Media [Iodinated Diagnostic Agents] Other (See Comments)    Weakness,   . Darvon [Propoxyphene] Other (See Comments)    Weakness, Fainting  . Wine [Alcohol] Other (See Comments)    Dizzy   . Penicillins Rash    Outpatient Encounter Medications as of 11/10/2019  Medication Sig  . calamine lotion Apply 1 application topically 2 (two) times daily. To affected area on the back and chest  . Calcium Citrate-Vitamin D (CALCIUM + D PO) Take 1 tablet by mouth every morning.  . CYMBALTA 60 MG capsule Take 1 capsule (60 mg total) by mouth daily.  Marland Kitchen gabapentin (NEURONTIN) 100 MG capsule Take 1 capsule (100 mg total) by mouth 3 (three) times daily.  Marland Kitchen NAMENDA 10 MG tablet TAKE 1 TABLET TWICE A DAY  FOR MEMORY  . potassium chloride (KLOR-CON) 8 MEQ tablet TAKE 1 TABLET BY MOUTH EVERY DAY  . simvastatin (ZOCOR) 10 MG tablet TAKE 1 TABLET BY MOUTH EVERY DAY  . tetanus & diphtheria toxoids, adult, (TENIVAC) 5-2 LFU injection Inject 0.5 mLs into the muscle once.  . traZODone (  DESYREL) 100 MG tablet Take 1 tablet (100 mg total) by mouth at bedtime.  . valACYclovir (VALTREX) 1000 MG tablet Take 1 tablet (1,000 mg total) by mouth 2 (two) times daily.  Marland Kitchen warfarin (COUMADIN) 3 MG tablet TAKE 1 TABLET BY MOUTH ON MONDAY,WEDNESDAY , FRIDAY AND SUNDAY  . warfarin (COUMADIN) 4 MG tablet TAKE 1 TABLET BY MOUTH ON TUESDAY, THURSDAY AND SATURDAY   No facility-administered encounter medications on file as of 11/10/2019.    Review of Systems  Constitutional: Negative for appetite change, chills, fatigue and fever.  Respiratory: Negative for cough, chest  tightness, shortness of breath and wheezing.   Cardiovascular: Negative for chest pain, palpitations and leg swelling.  Gastrointestinal: Negative for abdominal distention, abdominal pain, constipation, diarrhea, nausea and vomiting.  Skin: Positive for rash.       Rash resolved on the back and chest except right under the arm.  Neurological: Negative for dizziness, light-headedness, numbness and headaches.  Psychiatric/Behavioral: Negative for agitation and sleep disturbance. The patient is not nervous/anxious.     Immunization History  Administered Date(s) Administered  . Fluad Quad(high Dose 65+) 09/25/2019  . Hepatitis B 03/22/2003  . Influenza, High Dose Seasonal PF 10/02/2017, 09/17/2018  . Influenza,inj,Quad PF,6+ Mos 08/19/2015, 08/24/2016  . Influenza-Unspecified 09/27/2014  . PPD Test 03/22/2003  . Pneumococcal Conjugate-13 10/02/2014  . Pneumococcal Polysaccharide-23 08/02/2007  . Td 06/05/2000  . Tdap 08/08/2009, 09/25/2019  . Zoster 02/27/2015   Pertinent  Health Maintenance Due  Topic Date Due  . INFLUENZA VACCINE  Completed  . DEXA SCAN  Completed  . PNA vac Low Risk Adult  Completed   Fall Risk  11/10/2019 09/25/2019 04/27/2019 03/27/2019 12/13/2018  Falls in the past year? 0 1 0 0 0  Number falls in past yr: 0 0 0 0 0  Injury with Fall? 0 0 0 0 0    There were no vitals filed for this visit. There is no height or weight on file to calculate BMI. Physical Exam Unable to complete on telephone visit.   Labs reviewed: Recent Labs    09/25/19 1426  NA 140  K 4.1  CL 105  CO2 25  GLUCOSE 163*  BUN 29*  CREATININE 0.85  CALCIUM 9.4   Recent Labs    09/25/19 1426  AST 24  ALT 17  BILITOT 0.5  PROT 7.0   Recent Labs    12/14/18 1539 09/25/19 1426  WBC 5.3 4.3  NEUTROABS 2,237 2,326  HGB 13.6 14.5  HCT 39.9 44.5  MCV 89.3 90.1  PLT 230 237   Lab Results  Component Value Date   TSH 2.310 03/12/2015   Lab Results  Component Value Date    HGBA1C 5.5 04/12/2017   Lab Results  Component Value Date   CHOL 217 (A) 12/12/2015   HDL 68 12/12/2015   LDLCALC 122 12/12/2015   TRIG 137 12/12/2015   CHOLHDL 3.1 03/12/2015    Significant Diagnostic Results in last 30 days:  No results found.  Assessment/Plan  Herpes zoster without complication Afebrile.Rash has resolved on most part on the back and chest except under right arm.No drainage reported.Daughter would refill for valacyclovir t  - valACYclovir (VALTREX) 1000 MG tablet; Take 1 tablet (1,000 mg total) by mouth 2 (two) times daily for 7 days.  Dispense: 14 tablet; Refill: 0  Family/ staff Communication: Reviewed plan of care with patient and daughter.  Labs/tests ordered: None   Spent 11 minutes of non-face to face with patient  Sandrea Hughs, NP

## 2019-11-13 NOTE — Telephone Encounter (Signed)
Daughter notified on Thursday.

## 2019-12-08 ENCOUNTER — Other Ambulatory Visit: Payer: Self-pay | Admitting: Internal Medicine

## 2019-12-08 DIAGNOSIS — F5101 Primary insomnia: Secondary | ICD-10-CM

## 2019-12-08 MED ORDER — TRAZODONE HCL 100 MG PO TABS: 100.0000 mg | ORAL_TABLET | Freq: Every day | ORAL | 3 refills | Status: DC

## 2019-12-08 MED ORDER — SIMVASTATIN 10 MG PO TABS: 10.0000 mg | ORAL_TABLET | Freq: Every day | ORAL | 1 refills | Status: DC

## 2020-01-05 ENCOUNTER — Other Ambulatory Visit: Payer: Self-pay | Admitting: Internal Medicine

## 2020-01-09 ENCOUNTER — Telehealth: Payer: Self-pay

## 2020-01-09 NOTE — Telephone Encounter (Signed)
Pharmacy called and notified.

## 2020-01-09 NOTE — Telephone Encounter (Signed)
Generic namenda is fine.

## 2020-01-09 NOTE — Telephone Encounter (Signed)
Cala Bradford the Pharmacy Tech from [facility] mail [facility] left a voicemail stating that patient medication "Namenda 10mg " was sent in. However pharmacy tech states that in the medication order it doesn't state that patient is required to have name brand of the medication. Pharmacy tech wants to know if they can give patient generic brand. Pharmacy tech states that Name Brand would be $125 Copay and Generic would be $15 Copay. Pharmacy Contact Number is [phone]) & Reference Number is ( ( [phone] ) I will call back and give your statement. Please Advise.

## 2020-01-29 ENCOUNTER — Telehealth: Payer: Self-pay

## 2020-01-29 MED ORDER — AMBULATORY NON FORMULARY MEDICATION: 0 refills | Status: AC

## 2020-01-29 NOTE — Telephone Encounter (Signed)
Faith Ortiz would like an order sent to [facility] for an INR check on [address] in [facility]. She said because of Covid she is not coming here and would be more than happy to go get it checked if you send in the order. She can call and make the  appointment or she can just walk in.  .Address: [address], [place], [place] [address] Phone: [phone]

## 2020-01-29 NOTE — Telephone Encounter (Signed)
Order added, printed, signed by Dr.Reed and faxed to [phone]   Above information obtained from previous request.

## 2020-01-29 NOTE — Telephone Encounter (Signed)
-----   Message from Maurice Small, [place] sent at 01/29/2020 10:00 AM EST ----- Westley Hummer please confirm that you contacted patient. If you please create a telephone encounter for documentation. You can right click on staff message originally sent by Dr.Reed and select telephone, this will copy the information below into a telephone encounter. Under documentation note late entry- and document the date you called/attempted to call patient. We need to attempt to contact patients x 3 and if no success we can mail a letter.  Let me know if you need help  Thanks  CB ----- Message ----- From: Kermit Balo, DO Sent: 01/26/2020   5:48 AM EST To: Maurice Small, CMA, #  Mrs. Punt has had an in-office visit in Nov.  Her last INR was in October.  She remains on warfarin, but has not had a single INR with [place] since then and it appears she's been in this area b/c her daughter's been calling back and forth with [place] and she had that one in person visit and a phone visit in Dec.  She's been on valtrex and some pain meds for shingles.  I'm disturbed that this has gone this long without being checked.  She could have had INR while here and can get them checked in the car if necessary b/c her daughter is particularly concerned about covid.  Please call and find out if she's had INRs anywhere else and when she can get one here.  This is dangerous and my license is the one at stake even though she's being cared for by another provider in the interim.

## 2020-02-26 ENCOUNTER — Telehealth: Payer: Self-pay | Admitting: *Deleted

## 2020-02-26 NOTE — Telephone Encounter (Signed)
I appreciate the update.  Glad the INR was therapeutic.  Await records.

## 2020-02-26 NOTE — Telephone Encounter (Signed)
FYI- Omesha, daughter called and stated that she just wanted to let you know that patient was in the ER recently. Stated that she fell and daughter just wanted to take her and get her checked out. Stated that she is having them send you her records. She also wanted to let you know that they checked patient's INR and it was 2.0

## 2020-02-26 NOTE — Telephone Encounter (Signed)
INR: 2.0

## 2020-03-18 ENCOUNTER — Other Ambulatory Visit: Payer: Self-pay | Admitting: Internal Medicine

## 2020-03-18 NOTE — Telephone Encounter (Signed)
rx sent to pharmacy by e-script  

## 2020-04-07 ENCOUNTER — Other Ambulatory Visit: Payer: Self-pay | Admitting: Internal Medicine

## 2020-04-08 NOTE — Telephone Encounter (Signed)
Sent to Dr. Reed for approval  

## 2020-04-28 ENCOUNTER — Other Ambulatory Visit: Payer: Self-pay | Admitting: Internal Medicine

## 2020-04-30 NOTE — Telephone Encounter (Signed)
rx sent to pharmacy by e-script  

## 2020-05-10 ENCOUNTER — Other Ambulatory Visit: Payer: Self-pay

## 2020-05-10 MED ORDER — NAMENDA 10 MG PO TABS: ORAL_TABLET | ORAL | 1 refills | Status: DC

## 2020-05-10 NOTE — Telephone Encounter (Signed)
INR order is fine with me and should be at least monthly.  Will the results be sent to our office to address or will a doctor there be managing her results?

## 2020-05-10 NOTE — Telephone Encounter (Signed)
Patient's daughter is requesting an order to be sent to [facility] in [place] for INR as previously done per patient's daughter Faith Ortiz (same name). [phone]  She is also requesting Namenda be faxed back to [facility].   Routing to Dr. Renato Gails

## 2020-05-17 ENCOUNTER — Telehealth: Payer: Self-pay | Admitting: *Deleted

## 2020-05-17 DIAGNOSIS — Z7901 Long term (current) use of anticoagulants: Secondary | ICD-10-CM

## 2020-05-17 MED ORDER — NAMENDA 10 MG PO TABS: ORAL_TABLET | ORAL | 1 refills | Status: DC

## 2020-05-17 MED ORDER — NAMENDA 10 MG PO TABS: ORAL_TABLET | ORAL | 0 refills | Status: DC

## 2020-05-17 NOTE — Telephone Encounter (Signed)
Mrs. Hideko would like an order sent to [facility] for an INR check on [address] in [facility]. She said because of Covid she is not coming here and would be more than happy to go get it checked if you send in the order. She can call and make the  appointment or she can just walk in.  .Address: [address], [place], [place] [address] Phone: [phone]   Order added, printed, stamped signature by Dr.Reed and faxed to [phone]   Above information obtained from previous request   Patient daughter also requested refill on Namenda.

## 2020-05-31 ENCOUNTER — Telehealth: Payer: Self-pay

## 2020-05-31 MED ORDER — WARFARIN SODIUM 3 MG PO TABS: ORAL_TABLET | ORAL | 0 refills | Status: DC

## 2020-05-31 NOTE — Telephone Encounter (Signed)
[facility] [place] [place] [facility] War[facility]in 3 mg tablet one Monday, Wednesday, Friday and Sunday

## 2020-06-04 ENCOUNTER — Telehealth: Payer: Self-pay

## 2020-06-04 NOTE — Telephone Encounter (Signed)
Incoming fax received [facility] [place] [place]  I reviewed chart and was unable to locate last PT/INR check which is required as part of refilling the requested medication.   Also rx was filled on 05/31/2020  I called patients daughter and was unable to leave a message for her mailbox was full. I will try to reach her again later

## 2020-06-10 LAB — PROTIME-INR: Protime: 19.4 — AB (ref 10.0–13.8)

## 2020-06-10 LAB — POCT INR: INR: 1.9 — AB (ref <=1.1)

## 2020-06-10 NOTE — Telephone Encounter (Signed)
Spoke with patients daughter who indicated that patients [facility] will [facility] at [facility] (out of town)

## 2020-06-13 ENCOUNTER — Telehealth: Payer: Self-pay | Admitting: *Deleted

## 2020-06-13 NOTE — Telephone Encounter (Signed)
Patient daughter, Whitley, notified and agreed. Daughter requested INR order to be faxed for patient to get labs done there in [place]. In 4 weeks.  Order added, printed,stamped signature by Dr.Reed and faxed to [facility] at:  [phone]

## 2020-06-13 NOTE — Telephone Encounter (Signed)
Appears it was sent to [place] Monday evening after hours and I've been at [facility] homes since.  INR was slightly low at 1.9.  I recommend that for this week, she take 4mg  on tues, thurs and sat and 3mg  all other days.  Next week, she can resume her 3mg  all days except 4mg  on tues and thurs until next INR check in 4 weeks.  Will she getting the next INR here or where she is right now?

## 2020-06-13 NOTE — Telephone Encounter (Signed)
Patient daughter, Zakyia, called requesting the results of patient's INR done on Monday. Daughter is wondering if you have received these.   Current dose of Coumadin:  3mg  all days except 4mg  on Tuesday and Thursday.   Please Advise.

## 2020-06-28 ENCOUNTER — Other Ambulatory Visit: Payer: Self-pay | Admitting: Internal Medicine

## 2020-06-28 NOTE — Telephone Encounter (Signed)
Medication refilled by me Tashiana Lamarca.D/RMA. ERROR. Patient had ALT, AST, done in 09/25/2019, but no Lipid Panel. Patient needs updated lab. Pharmacy called and Simvastatin cancelled/rejected.

## 2020-07-01 ENCOUNTER — Telehealth: Payer: Self-pay | Admitting: Internal Medicine

## 2020-07-01 NOTE — Telephone Encounter (Signed)
I called the patient today ( 07/01/20 ) to try and make an appt for a medication refill. I was sent to the voicemail and the voicemail box was full.

## 2020-07-08 MED ORDER — SIMVASTATIN 10 MG PO TABS: 10.0000 mg | ORAL_TABLET | Freq: Every day | ORAL | 1 refills | Status: DC

## 2020-07-08 NOTE — Telephone Encounter (Signed)
Patient daughter called and stated that patient is still currently in [place] and needs a refill on Simvastatin.   Pended and sent to Dr. Renato Gails for approval.

## 2020-07-08 NOTE — Addendum Note (Signed)
Addended by: Nelda Severe A on: 07/08/2020 12:48 PM   Modules accepted: Orders

## 2020-07-22 ENCOUNTER — Telehealth: Payer: Self-pay | Admitting: *Deleted

## 2020-07-22 MED ORDER — MEMANTINE HCL 10 MG PO TABS: 10.0000 mg | ORAL_TABLET | Freq: Two times a day (BID) | ORAL | 1 refills | Status: DC

## 2020-07-22 NOTE — Telephone Encounter (Signed)
Faith Ortiz with [facility] called and stated that Namenda 10mg  is not available and they are wanting to know if it is ok to use Generic instead.  Please Advise.   Reference #: 

## 2020-07-22 NOTE — Telephone Encounter (Signed)
Generic memantine is fine with me.

## 2020-07-22 NOTE — Telephone Encounter (Signed)
Medication list updated and Rx sent to Pharmacy.  

## 2020-07-23 ENCOUNTER — Other Ambulatory Visit: Payer: Self-pay | Admitting: *Deleted

## 2020-07-23 MED ORDER — CYMBALTA 60 MG PO CPEP: 60.0000 mg | ORAL_CAPSULE | Freq: Every day | ORAL | 0 refills | Status: DC

## 2020-07-23 NOTE — Telephone Encounter (Signed)
Patient daughter called and stated that they need a short supply sent to the local pharmacy there until they can receive patient's mail order.   Pended and sent to Dr. Renato Gails for approval due to HIGH ALERT Warning.

## 2020-07-25 ENCOUNTER — Other Ambulatory Visit: Payer: Self-pay | Admitting: *Deleted

## 2020-07-25 MED ORDER — CYMBALTA 60 MG PO CPEP
60.0000 mg | ORAL_CAPSULE | Freq: Every day | ORAL | 1 refills | Status: DC
Start: 2020-07-25 — End: 2021-02-10

## 2020-07-25 NOTE — Telephone Encounter (Signed)
Refaxed rx to [facility].  Had to fax rx to local pharmacy until they receive mail order.   Pended Rx and sent to Dr. Renato Gails for approval. Due to HIGH ALERT Warning.

## 2020-07-30 ENCOUNTER — Other Ambulatory Visit: Payer: Self-pay | Admitting: *Deleted

## 2020-07-30 NOTE — Telephone Encounter (Signed)
I don't see anything documented about use getting the follow-up INR I had ordered for mid august to be done where she was staying at that time.  Can you find anything?  I would like to see that (know result and plan) before we send in warfarin for her to keep taking.

## 2020-07-30 NOTE — Telephone Encounter (Signed)
Received request from pharmacy Pended Rx and sent to Dr. Renato Gails for approval due to HIGH ALERT Warning.

## 2020-07-31 NOTE — Telephone Encounter (Signed)
Thank you.  This happens too often for this patient.

## 2020-07-31 NOTE — Telephone Encounter (Signed)
I called [facility] [phone] and there are no results for PT/INR for August. An order was faxed to [facility] on 06/13/2020 for patient to get labs done in 4 weeks (August).  Tried calling daughter. Will try again.

## 2020-08-02 NOTE — Telephone Encounter (Signed)
Outgoing call placed to patients daughter, mailbox was full. I will try to call patient again later.

## 2020-08-03 ENCOUNTER — Other Ambulatory Visit: Payer: Self-pay | Admitting: Adult Health

## 2020-08-03 MED ORDER — WARFARIN SODIUM 4 MG PO TABS
ORAL_TABLET | ORAL | 0 refills | Status: DC
Start: 2020-08-03 — End: 2020-12-23

## 2020-08-06 LAB — PROTIME-INR: INR: 1.9 — AB (ref 0.9–1.1)

## 2020-08-06 NOTE — Telephone Encounter (Signed)
Daughter is getting PT/INR drawn today.  Refaxed the Order to [facility].

## 2020-08-09 ENCOUNTER — Telehealth: Payer: Self-pay

## 2020-08-09 NOTE — Telephone Encounter (Signed)
I called the patient to set up an appointment for her to come in to talk with Dr. Renato Gails about her Simvastatin  20 mg. Patient stated that she is out of town for the next 2 months. She in turn asked about her  INR check that was done on 06/10/2020 and resulted @ 1.9 She  wanted Dr. Hebert Soho thoughts about it. Please advise. She said once she gets back to [place] she will make an appointment. Meanwhile the paper is in the red folder on McCalla CMA desk.

## 2020-08-09 NOTE — Telephone Encounter (Signed)
I recommend she start taking 4mg  instead of 3mg  on wednesdays also for the next 2 weeks (remaining days stay the same) and recheck INR in 2 weeks.

## 2020-08-15 NOTE — Telephone Encounter (Signed)
I called and left a message on her voicemail for her to call back

## 2020-08-19 NOTE — Telephone Encounter (Signed)
I called patient and got the voicemail, I asked her to please call out office back

## 2020-08-20 NOTE — Telephone Encounter (Signed)
Cledith called back and stated that she was returning a call. I told her that it was involving her Protime and she stated that she has already spoken to someone about this and taken care of it. Confirmed dosage with her and informed her that she had to have redraw 2 weeks. She stated that she already knew and will take care of it.

## 2020-08-20 NOTE — Telephone Encounter (Signed)
I called again this morning to speak with Faith Ortiz about the change in her medication and to inform her that it's time for her INR to be rechecked, however her voicemail is full. She is out of state and I don't see a forwarding address for her so I can send her a copy. Please advise.

## 2020-08-20 NOTE — Telephone Encounter (Signed)
It appears this was already addressed by Synetta Fail and we should have another INR result or two by now since the INR 1.9 was from 7/12.  I don't see anything more recent abstracted.

## 2020-10-19 LAB — POCT INR: INR: 2.1 — AB (ref 0.9–1.1)

## 2020-10-21 ENCOUNTER — Encounter: Payer: Self-pay | Admitting: Internal Medicine

## 2020-10-21 ENCOUNTER — Telehealth: Payer: Self-pay

## 2020-10-21 NOTE — Telephone Encounter (Signed)
Per Dr. Renato Gails: Continue same dose of coumadin Recheck INR in 4 weeks . Message given to Valari (daughter)

## 2020-10-28 ENCOUNTER — Ambulatory Visit: Payer: Self-pay | Admitting: Internal Medicine

## 2020-11-01 ENCOUNTER — Telehealth: Payer: Self-pay | Admitting: *Deleted

## 2020-11-01 NOTE — Telephone Encounter (Signed)
Daughter, Shenekia, Notified and agreed.

## 2020-11-01 NOTE — Telephone Encounter (Signed)
Cristabel, daughter called wanting to know if patient should get another Pneumonia injection along with her Flu Shot this year.   Daughter is wanting to make sure it is ok to get both since being Covid Vaccinated. Has not received the booster yet.   Please Advise.     Immunization Administration History: Pneumococcal 13- 10/02/2014 Pneumococcal 23- 08/02/2007

## 2020-11-01 NOTE — Telephone Encounter (Signed)
She does not need another pneumonia vaccine at this time.  It will be ok for her to get her flu shot.  I am recommending (as you all know there) that the patients take their vaccines 2 weeks apart from one another so they know if they have any problems.  She should get her booster, as well.

## 2020-11-05 ENCOUNTER — Other Ambulatory Visit: Payer: Self-pay | Admitting: Internal Medicine

## 2020-11-05 NOTE — Telephone Encounter (Signed)
Patient has an appointment on 11/07/2020 with Dr. Renato Gails scrip was filled for 30 days

## 2020-11-07 ENCOUNTER — Ambulatory Visit: Payer: Self-pay | Admitting: Internal Medicine

## 2020-12-09 ENCOUNTER — Other Ambulatory Visit: Payer: Self-pay | Admitting: Internal Medicine

## 2020-12-09 NOTE — Telephone Encounter (Signed)
Patient has request refill on medication "Potassium Chloride". Patient medication last refilled on 11/05/2020 with 30 tablets to be taken once daily. I'm not sure if this medication is long term. Medication pend and sent to PCP Renato Gails, Tiffany L, DO.  Please Advise.

## 2020-12-23 ENCOUNTER — Other Ambulatory Visit: Payer: Self-pay

## 2020-12-23 MED ORDER — WARFARIN SODIUM 4 MG PO TABS
ORAL_TABLET | ORAL | 0 refills | Status: DC
Start: 2020-12-23 — End: 2021-01-13

## 2020-12-23 MED ORDER — POTASSIUM CHLORIDE ER 8 MEQ PO TBCR
8.0000 meq | EXTENDED_RELEASE_TABLET | Freq: Every day | ORAL | 0 refills | Status: DC
Start: 2020-12-23 — End: 2021-02-10

## 2020-12-23 MED ORDER — WARFARIN SODIUM 3 MG PO TABS
ORAL_TABLET | ORAL | 0 refills | Status: DC
Start: 2020-12-23 — End: 2021-01-13

## 2020-12-23 NOTE — Telephone Encounter (Signed)
Patients daughter called requesting to schedule a lab appointment for PT/INR. I offered to scheduled patient an appointment with Bufford Spikes L, DO instead since last OV was 11/10/2019 (greater than 12 months ago). Patienst daughter states she only would like lab appointment at this time.  Patients daughter also asked for refills on Potassium and 2 doses of warfarin. RX's pended and sent to Bufford Spikes L, DO for review as patient has not been seen in over a year.   Mariama (daughter) was also concerned that a letter was mailed about patient being dismissed for request cancellation or no showing of appointments, Andreka thinks this was sent in error as patient was out of state.

## 2020-12-24 ENCOUNTER — Other Ambulatory Visit: Payer: Self-pay

## 2020-12-24 ENCOUNTER — Other Ambulatory Visit: Payer: Federal, State, Local not specified - PPO

## 2020-12-24 DIAGNOSIS — Z5181 Encounter for therapeutic drug level monitoring: Secondary | ICD-10-CM

## 2020-12-24 DIAGNOSIS — Z7901 Long term (current) use of anticoagulants: Secondary | ICD-10-CM

## 2020-12-24 NOTE — Telephone Encounter (Signed)
The letter was sent intentionally due to concerns we have about patient being seen by a physician or NP on a regular basis.  We only receive some of her INR results when she is out of town and no documentation about any medical visits.  We cannot keep prescribing medications without laying eyes on patients certainly beyond one year for sure.  I understand she did come in for the lab today.

## 2020-12-24 NOTE — Telephone Encounter (Signed)
Dr.Reed can you weigh in on patients refusal to schedule an appointment with a provider, how will we address this going forward when patient request refills.

## 2020-12-25 ENCOUNTER — Other Ambulatory Visit: Payer: Self-pay | Admitting: Internal Medicine

## 2020-12-25 LAB — PROTIME-INR
INR: 2.9 — ABNORMAL HIGH
Prothrombin Time: 27.3 s — ABNORMAL HIGH (ref 9.0–11.5)

## 2020-12-26 ENCOUNTER — Other Ambulatory Visit: Payer: Self-pay

## 2020-12-26 MED ORDER — MEMANTINE HCL 10 MG PO TABS: 10.0000 mg | ORAL_TABLET | Freq: Two times a day (BID) | ORAL | 0 refills | Status: DC

## 2020-12-26 NOTE — Telephone Encounter (Signed)
Incoming call received from patients daughter stating she left her namenda out of town and needs a rx sent to [facility] with the notation ok to dispense generic.  Request completed as requested  Ms. Clinton also requested a 4 day supply of [facility] until mail order received  Request competed as requested

## 2021-01-07 ENCOUNTER — Other Ambulatory Visit: Payer: Self-pay | Admitting: Internal Medicine

## 2021-01-07 DIAGNOSIS — F5101 Primary insomnia: Secondary | ICD-10-CM

## 2021-01-07 MED ORDER — SIMVASTATIN 10 MG PO TABS: 10.0000 mg | ORAL_TABLET | Freq: Every day | ORAL | 3 refills | Status: DC

## 2021-01-07 MED ORDER — TRAZODONE HCL 100 MG PO TABS: 100.0000 mg | ORAL_TABLET | Freq: Every day | ORAL | 3 refills | Status: DC

## 2021-01-07 NOTE — Telephone Encounter (Signed)
She has not been seen recently so not sure if another provider in [place] changed her dose???   I see that in Jan, Dee sent trazodone 100mg , but in May, Charlene sent 150mg .  Let's call her daughter to clarify the dose she is currently taking and send it.

## 2021-01-07 NOTE — Telephone Encounter (Signed)
We also need to clarify if she's still in [place] or at home at this moment to send it to the correct pharmacy.

## 2021-01-07 NOTE — Telephone Encounter (Signed)
Patient has request refill on medications "Simvastatin 10 mg" and "Trazodone 100 mg". I approved refill for Simvastatin. However I pended "Trazodone 100 mg" for PCP Reed, Tiffany L, DO approval. Please confirm that patient is taking both Trazodone 150 mg and Trazodone 100 mg. Routed to PCP Reed, Tiffany L, DO .

## 2021-01-07 NOTE — Telephone Encounter (Signed)
Called patient daughter and she states that patient is back in [place]. Patient daughter also confirmed patient is taking Trazodone 100 mg. [facility] [address] at the [address] of [address]. Message routed back to PCP Reed, Tiffany L, DO .

## 2021-01-12 ENCOUNTER — Other Ambulatory Visit: Payer: Self-pay | Admitting: Family

## 2021-01-13 ENCOUNTER — Other Ambulatory Visit: Payer: Self-pay | Admitting: Adult Health

## 2021-01-13 MED ORDER — WARFARIN SODIUM 4 MG PO TABS: ORAL_TABLET | ORAL | 0 refills | Status: DC

## 2021-01-13 NOTE — Telephone Encounter (Signed)
Noted and refills sent into pharmacy.

## 2021-01-13 NOTE — Telephone Encounter (Signed)
She is on the alternating doses.

## 2021-01-13 NOTE — Telephone Encounter (Signed)
Patient has request refill on medication "Warfarin 3 mg " and "Warfarin 4mg " . I rejected one of the orders not noticing there was two different dosages. Please confirm that patient is taking both medications. Medications pend and sent to PCP , Tiffany L, DO . Please Advise.

## 2021-01-14 ENCOUNTER — Other Ambulatory Visit: Payer: Self-pay | Admitting: Family

## 2021-01-16 ENCOUNTER — Other Ambulatory Visit: Payer: Self-pay | Admitting: Family

## 2021-01-19 ENCOUNTER — Other Ambulatory Visit: Payer: Self-pay | Admitting: Internal Medicine

## 2021-01-20 ENCOUNTER — Ambulatory Visit: Payer: Federal, State, Local not specified - PPO | Admitting: Internal Medicine

## 2021-01-20 ENCOUNTER — Other Ambulatory Visit: Payer: Self-pay

## 2021-01-20 ENCOUNTER — Encounter: Payer: Self-pay | Admitting: Internal Medicine

## 2021-01-20 VITALS — BP 100/80 | HR 87 | Temp 97.0°F | Ht 64.0 in | Wt 111.8 lb

## 2021-01-20 DIAGNOSIS — Z5181 Encounter for therapeutic drug level monitoring: Secondary | ICD-10-CM | POA: Diagnosis not present

## 2021-01-20 DIAGNOSIS — I825Z9 Chronic embolism and thrombosis of unspecified deep veins of unspecified distal lower extremity: Secondary | ICD-10-CM

## 2021-01-20 DIAGNOSIS — G301 Alzheimer's disease with late onset: Secondary | ICD-10-CM

## 2021-01-20 DIAGNOSIS — E785 Hyperlipidemia, unspecified: Secondary | ICD-10-CM

## 2021-01-20 DIAGNOSIS — Z23 Encounter for immunization: Secondary | ICD-10-CM

## 2021-01-20 DIAGNOSIS — F5101 Primary insomnia: Secondary | ICD-10-CM

## 2021-01-20 DIAGNOSIS — F0280 Dementia in other diseases classified elsewhere without behavioral disturbance: Secondary | ICD-10-CM

## 2021-01-20 DIAGNOSIS — Z7901 Long term (current) use of anticoagulants: Secondary | ICD-10-CM | POA: Diagnosis not present

## 2021-01-20 DIAGNOSIS — L602 Onychogryphosis: Secondary | ICD-10-CM | POA: Diagnosis not present

## 2021-01-20 LAB — CBC WITH DIFFERENTIAL/PLATELET
Absolute Monocytes: 263 cells/uL (ref 200–950)
Basophils Absolute: 19 cells/uL (ref 0–200)
Basophils Relative: 0.5 %
Eosinophils Absolute: 100 cells/uL (ref 15–500)
Eosinophils Relative: 2.7 %
HCT: 41.1 % (ref 35.0–45.0)
Hemoglobin: 14 g/dL (ref 11.7–15.5)
Lymphs Abs: 1406 cells/uL (ref 850–3900)
MCH: 30.5 pg (ref 27.0–33.0)
MCHC: 34.1 g/dL (ref 32.0–36.0)
MCV: 89.5 fL (ref 80.0–100.0)
MPV: 11.2 fL (ref 7.5–12.5)
Monocytes Relative: 7.1 %
Neutro Abs: 1913 cells/uL (ref 1500–7800)
Neutrophils Relative %: 51.7 %
Platelets: 192 10*3/uL (ref 140–400)
RBC: 4.59 10*6/uL (ref 3.80–5.10)
RDW: 12.4 % (ref 11.0–15.0)
Total Lymphocyte: 38 %
WBC: 3.7 10*3/uL — ABNORMAL LOW (ref 3.8–10.8)

## 2021-01-20 LAB — COMPLETE METABOLIC PANEL WITH GFR
AG Ratio: 1.3 (calc) (ref 1.0–2.5)
ALT: 12 U/L (ref 6–29)
AST: 22 U/L (ref 10–35)
Albumin: 3.8 g/dL (ref 3.6–5.1)
Alkaline phosphatase (APISO): 61 U/L (ref 37–153)
BUN/Creatinine Ratio: 37 (calc) — ABNORMAL HIGH (ref 6–22)
BUN: 28 mg/dL — ABNORMAL HIGH (ref 7–25)
CO2: 25 mmol/L (ref 20–32)
Calcium: 9.5 mg/dL (ref 8.6–10.4)
Chloride: 106 mmol/L (ref 98–110)
Creat: 0.75 mg/dL (ref 0.60–0.88)
GFR, Est African American: 83 mL/min/{1.73_m2} (ref >=60)
GFR, Est Non African American: 72 mL/min/{1.73_m2} (ref >=60)
Globulin: 3 g/dL (calc) (ref 1.9–3.7)
Glucose, Bld: 112 mg/dL — ABNORMAL HIGH (ref 65–99)
Potassium: 4.2 mmol/L (ref 3.5–5.3)
Sodium: 138 mmol/L (ref 135–146)
Total Bilirubin: 0.6 mg/dL (ref 0.2–1.2)
Total Protein: 6.8 g/dL (ref 6.1–8.1)

## 2021-01-20 LAB — LIPID PANEL
Cholesterol: 178 mg/dL (ref <200)
HDL: 64 mg/dL (ref >=50)
LDL Cholesterol (Calc): 95 mg/dL (calc)
Non-HDL Cholesterol (Calc): 114 mg/dL (calc) (ref <130)
Total CHOL/HDL Ratio: 2.8 (calc) (ref <5.0)
Triglycerides: 95 mg/dL (ref <150)

## 2021-01-20 LAB — POCT INR: INR: 1.5 — AB (ref 2.0–3.0)

## 2021-01-20 NOTE — Patient Instructions (Addendum)
Please call us back with the dates of the covid vaccines so we can update her record.  Please get your eye exam.  Let me know if you need a referral. Also please go to podiatry for your toenail trimming.

## 2021-01-20 NOTE — Progress Notes (Signed)
Location:  [facility] clinic Provider:  Daleyssa Loiselle L. Renato Gails, D.O., C.M.D.  Code Status: DNR Goals of Care:  Advanced Directives 12/13/2018  Does Patient Have a Medical Advance Directive? No  Type of Advance Directive -  Copy of Healthcare Power of Attorney in Chart? -  Would patient like information on creating a medical advance directive? No - Patient declined     Chief Complaint  Patient presents with  . Follow-up    4 week follo wup PT/INR check.Patient has fungus on both her big toes that need to be looked at. Patient needs Flu vaccine.    HPI: Patient is a 87 y.o. female seen today for medical management of chronic diseases.    They were in [place] for 11 mos. Had covid vaccines and booster.   They follow strict quarantine. She does need her flu shot.    INR 1.5.  Goal is 2-3.   A week before, she did miss her night pills once.  Her daughter watches her take them normally.  Takes 4mg  alternating with 3mg .    Trazodone seems too strong at 150mg --makes her groggy-- and does not sleep at 100mg .  Says she does not fall asleep on trazodone, but her daughter notes she comes in and she is passed out.    Does not like to go to podiatry.  Has thickened nails.    They did a dinner for her bday--now 48 and going to have a team call for friends/family.  Mood has been ok.  Normally naps later morning. She is wanting things to do--they're going to get her a puzzle and table.  has been tough.    Eats a little bit each day--has small portions--will not eat it if a large portion is presented.  Has two boosts per day.  Past Medical History:  Diagnosis Date  . Alzheimer disease (HCC)   . Cataract 2011-2013   left  . Depression   . Mitral regurgitation   . TIA (transient ischemic attack)     Past Surgical History:  Procedure Laterality Date  . ABDOMINAL HYSTERECTOMY    . APPENDECTOMY    . BLADDER REPAIR    . CESAREAN SECTION    . CESAREAN SECTION    . CESAREAN SECTION     . TONSILLECTOMY      Allergies  Allergen Reactions  . Codeine Other (See Comments)    unknown  . Contrast Media [Iodinated Diagnostic Agents] Other (See Comments)    Weakness,   . Darvon [Propoxyphene] Other (See Comments)    Weakness, Fainting  . Wine [Alcohol] Other (See Comments)    Dizzy   . Penicillins Rash    Outpatient Encounter Medications as of 01/20/2021  Medication Sig  . AMBULATORY NON FORMULARY MEDICATION Please Draw PT/INR on ASAP Dx: Z51.81, I82.5Z9 and fax to Dr. 12-04-2000 @@ [facility] Fax#:[phone]  . Calcium Citrate-Vitamin D (CALCIUM + D PO) Take 1 tablet by mouth every morning.  . CYMBALTA 60 MG capsule Take 1 capsule (60 mg total) by mouth daily.  04-29-1969 gabapentin (NEURONTIN) 100 MG capsule Take 1 capsule (100 mg total) by mouth 3 (three) times daily.  . memantine (NAMENDA) 10 MG tablet Take 1 tablet (10 mg total) by mouth 2 (two) times daily.  . potassium chloride (KLOR-CON) 8 MEQ tablet Take 1 tablet (8 mEq total) by mouth daily.  . simvastatin (ZOCOR) 10 MG tablet Take 1 tablet (10 mg total) by mouth daily.  Renato Gails tetanus & diphtheria toxoids,  adult, (TENIVAC) 5-2 LFU injection Inject 0.5 mLs into the muscle once.  . traZODone (DESYREL) 100 MG tablet Take 1 tablet (100 mg total) by mouth at bedtime.  Marland Kitchen warfarin (COUMADIN) 3 MG tablet TAKE 1 TABLET BY MOUTH ON MONDAY,WEDNESDAY , FRIDAY AND SUNDAY  . warfarin (COUMADIN) 4 MG tablet TAKE 1 TABLET BY MOUTH ON TUESDAY, THURSDAY AND SATURDAY  . [DISCONTINUED] calamine lotion Apply 1 application topically 2 (two) times daily. To affected area on the back and chest   No facility-administered encounter medications on file as of 01/20/2021.    Review of Systems:  Review of Systems  Constitutional: Negative for chills and fever.  HENT: Negative for congestion, hearing loss and sore throat.   Eyes: Negative for blurred vision.  Respiratory: Negative for cough and shortness of breath.   Cardiovascular: Negative  for chest pain, palpitations and leg swelling.  Gastrointestinal: Negative for abdominal pain, blood in stool, constipation and melena.  Genitourinary: Negative for dysuria.  Musculoskeletal: Negative for falls and joint pain.  Skin: Negative for itching and rash.  Neurological: Negative for dizziness and loss of consciousness.  Psychiatric/Behavioral: Positive for memory loss. Negative for depression. The patient has insomnia. The patient is not nervous/anxious.     Health Maintenance  Topic Date Due  . COVID-19 Vaccine (1) Never done  . INFLUENZA VACCINE  06/30/2020  . TETANUS/TDAP  09/24/2029  . DEXA SCAN  Completed  . PNA vac Low Risk Adult  Completed    Physical Exam: Vitals:   01/20/21 1203  BP: 100/80  Pulse: 87  Temp: (!) 97 F (36.1 C)  SpO2: 97%  Weight: 111 lb 12.8 oz (50.7 kg)  Height: 5\' 4"  (1.626 m)   Body mass index is 19.19 kg/m. Physical Exam Vitals reviewed.  Constitutional:      General: She is not in acute distress.    Appearance: Normal appearance. She is not toxic-appearing.  HENT:     Head: Normocephalic and atraumatic.  Cardiovascular:     Rate and Rhythm: Normal rate and regular rhythm.     Heart sounds: Murmur heard.    Pulmonary:     Effort: Pulmonary effort is normal.     Breath sounds: Normal breath sounds. No wheezing, rhonchi or rales.  Abdominal:     General: Bowel sounds are normal. There is no distension.     Palpations: Abdomen is soft.     Tenderness: There is no abdominal tenderness. There is no guarding or rebound.  Musculoskeletal:        General: Normal range of motion.     Right lower leg: No edema.     Left lower leg: No edema.  Skin:    General: Skin is warm and dry.  Neurological:     General: No focal deficit present.     Mental Status: She is alert.     Motor: No weakness.     Gait: Gait normal.     Comments: Oriented to person, place; declines foot exam here but agrees to podiatry referral where thickened  nails can be trimmed  Psychiatric:        Mood and Affect: Mood normal.        Behavior: Behavior normal.     Labs reviewed: Basic Metabolic Panel: No results for input(s): NA, K, CL, CO2, GLUCOSE, BUN, CREATININE, CALCIUM, MG, PHOS, TSH in the last 8760 hours. Liver Function Tests: No results for input(s): AST, ALT, ALKPHOS, BILITOT, PROT, ALBUMIN in the last 8760 hours.  No results for input(s): LIPASE, AMYLASE in the last 8760 hours. No results for input(s): AMMONIA in the last 8760 hours. CBC: No results for input(s): WBC, NEUTROABS, HGB, HCT, MCV, PLT in the last 8760 hours. Lipid Panel: No results for input(s): CHOL, HDL, LDLCALC, TRIG, CHOLHDL, LDLDIRECT in the last 8760 hours. Lab Results  Component Value Date   HGBA1C 5.5 04/12/2017    Procedures since last visit: No results found.  Assessment/Plan 1. Anticoagulation goal of INR 2 to 3 - POC INR today 1.5, but missed at least one dose  -not changed, but advised to please be sure she is watched when taking meds so not missed  2. Thickened nails - refused to let me see, but agrees to podiatry referral for trimming  - Ambulatory referral to Podiatry  3. Chronic deep vein thrombosis (DVT) of distal vein of lower extremity, unspecified laterality (HCC) -reason for anticoagulation, cont coumadin for INR goal 2-3  4. Late onset Alzheimer's disease without behavioral disturbance (HCC) - stays with daughter, primary caregiver - CBC with Differential/Platelet - COMPLETE METABOLIC PANEL WITH GFR  5. Hyperlipidemia, unspecified hyperlipidemia type - f/u labs today, cont zocor - CBC with Differential/Platelet - COMPLETE METABOLIC PANEL WITH GFR - Lipid panel  6. Primary insomnia -cont trazodone at just 100mg --higher dose make her unsteady and at risk for falls and her daughter reports that when she checks on her she is out cold sleeping - CBC with Differential/Platelet - COMPLETE METABOLIC PANEL WITH GFR  7. Need for  influenza vaccination -high dose flu shot given  Labs/tests ordered:   Lab Orders     CBC with Differential/Platelet     COMPLETE METABOLIC PANEL WITH GFR     Lipid panel     POC INR Flu shot   Next appt:  6 mos med mgt here  Chapel Silverthorn L. Keirah Konitzer, D.O. [facility] [address], [place] Cell Phone (Mon-Fri 8am-5pm):  [phone] On Call:  [phone] & follow prompts after 5pm & weekends Office Phone:  [phone] Office Fax:  [phone]

## 2021-01-21 NOTE — Progress Notes (Signed)
Labs overall look good.  No changes needed to her regimen.

## 2021-01-22 ENCOUNTER — Ambulatory Visit: Payer: Self-pay | Admitting: Internal Medicine

## 2021-01-28 ENCOUNTER — Ambulatory Visit: Payer: Federal, State, Local not specified - PPO | Admitting: Podiatry

## 2021-02-08 ENCOUNTER — Other Ambulatory Visit: Payer: Self-pay | Admitting: Internal Medicine

## 2021-02-08 DIAGNOSIS — Z5181 Encounter for therapeutic drug level monitoring: Secondary | ICD-10-CM

## 2021-02-08 DIAGNOSIS — Z7901 Long term (current) use of anticoagulants: Secondary | ICD-10-CM

## 2021-02-08 DIAGNOSIS — I825Z9 Chronic embolism and thrombosis of unspecified deep veins of unspecified distal lower extremity: Secondary | ICD-10-CM

## 2021-02-09 ENCOUNTER — Other Ambulatory Visit: Payer: Self-pay | Admitting: Family

## 2021-02-10 ENCOUNTER — Telehealth: Payer: Self-pay

## 2021-02-10 MED ORDER — CYMBALTA 60 MG PO CPEP: 60.0000 mg | ORAL_CAPSULE | Freq: Every day | ORAL | 1 refills | Status: DC

## 2021-02-10 MED ORDER — WARFARIN SODIUM 4 MG PO TABS: ORAL_TABLET | ORAL | 0 refills | Status: DC

## 2021-02-10 MED ORDER — CYMBALTA 60 MG PO CPEP: 60.0000 mg | ORAL_CAPSULE | Freq: Every day | ORAL | 0 refills | Status: DC

## 2021-02-10 NOTE — Telephone Encounter (Signed)
Patients daughter called stating she needs for [place] to [facility] and authorize for her mother to have the generic brand of the Cymbalta (pharmacy is out of brand name) for 7 day supply, that will hold her mother over until the brand name supply is received from the mail order company.   [facility] and spoke with the pharmacist authorizing generic formalation as requested by patients daughter.  Message will be sent to Dr.Reed as a FYI and to confirm that this action is ok

## 2021-02-10 NOTE — Telephone Encounter (Signed)
Called patients daughter, scheduled appointment to check INR via lab work on 02/21/2021

## 2021-02-10 NOTE — Telephone Encounter (Signed)
She should have had a 4-week INR check after her last appt (was feb)--I did not indicate that on the AVS.

## 2021-02-10 NOTE — Telephone Encounter (Signed)
No pending appointment for INR check. Will send to Tiffany L. Reed, D.O. to advise when next PT/INR is due

## 2021-02-21 ENCOUNTER — Other Ambulatory Visit: Payer: Self-pay

## 2021-02-21 DIAGNOSIS — I825Z9 Chronic embolism and thrombosis of unspecified deep veins of unspecified distal lower extremity: Secondary | ICD-10-CM

## 2021-02-21 DIAGNOSIS — Z5181 Encounter for therapeutic drug level monitoring: Secondary | ICD-10-CM

## 2021-02-21 DIAGNOSIS — Z7901 Long term (current) use of anticoagulants: Secondary | ICD-10-CM

## 2021-02-24 ENCOUNTER — Other Ambulatory Visit: Payer: Self-pay

## 2021-03-04 ENCOUNTER — Other Ambulatory Visit: Payer: Self-pay | Admitting: Family

## 2021-03-04 ENCOUNTER — Other Ambulatory Visit: Payer: Self-pay | Admitting: Internal Medicine

## 2021-03-19 ENCOUNTER — Other Ambulatory Visit: Payer: Self-pay

## 2021-03-19 DIAGNOSIS — Z7901 Long term (current) use of anticoagulants: Secondary | ICD-10-CM

## 2021-03-19 DIAGNOSIS — I825Z9 Chronic embolism and thrombosis of unspecified deep veins of unspecified distal lower extremity: Secondary | ICD-10-CM

## 2021-03-19 DIAGNOSIS — Z5181 Encounter for therapeutic drug level monitoring: Secondary | ICD-10-CM

## 2021-03-20 ENCOUNTER — Other Ambulatory Visit: Payer: Self-pay

## 2021-03-20 DIAGNOSIS — I825Z9 Chronic embolism and thrombosis of unspecified deep veins of unspecified distal lower extremity: Secondary | ICD-10-CM

## 2021-03-20 DIAGNOSIS — Z5181 Encounter for therapeutic drug level monitoring: Secondary | ICD-10-CM

## 2021-03-20 DIAGNOSIS — Z7901 Long term (current) use of anticoagulants: Secondary | ICD-10-CM

## 2021-03-21 LAB — PROTIME-INR
INR: 1.9 — ABNORMAL HIGH
Prothrombin Time: 18.7 s — ABNORMAL HIGH (ref 9.0–11.5)

## 2021-03-25 ENCOUNTER — Other Ambulatory Visit: Payer: Self-pay

## 2021-03-25 MED ORDER — POTASSIUM CHLORIDE ER 8 MEQ PO TBCR: 8.0000 meq | EXTENDED_RELEASE_TABLET | Freq: Every day | ORAL | 4 refills | Status: DC

## 2021-03-25 MED ORDER — WARFARIN SODIUM 4 MG PO TABS: ORAL_TABLET | ORAL | 0 refills | Status: DC

## 2021-03-25 NOTE — Telephone Encounter (Signed)
Daughter would like to have these medication refilled at sent to pharmacy in [place], [place]. Medications pended and sent to Richarda Blade, NP for approval.  Potassium 8 meq was filled 03/04/2021 [facility] [address] In [place], [place]

## 2021-03-25 NOTE — Telephone Encounter (Signed)
This encounter was created in error - please disregard.

## 2021-03-26 ENCOUNTER — Other Ambulatory Visit: Payer: Self-pay

## 2021-03-26 MED ORDER — POTASSIUM CHLORIDE ER 8 MEQ PO TBCR: 8.0000 meq | EXTENDED_RELEASE_TABLET | Freq: Every day | ORAL | 4 refills | Status: DC

## 2021-03-26 MED ORDER — WARFARIN SODIUM 4 MG PO TABS: ORAL_TABLET | ORAL | 0 refills | Status: DC

## 2021-03-26 NOTE — Telephone Encounter (Signed)
Sent to wrong pharmacy.

## 2021-04-08 ENCOUNTER — Other Ambulatory Visit: Payer: Self-pay

## 2021-04-08 ENCOUNTER — Telehealth (INDEPENDENT_AMBULATORY_CARE_PROVIDER_SITE_OTHER): Payer: Federal, State, Local not specified - PPO | Admitting: Nurse Practitioner

## 2021-04-08 ENCOUNTER — Telehealth: Payer: Self-pay

## 2021-04-08 DIAGNOSIS — E785 Hyperlipidemia, unspecified: Secondary | ICD-10-CM

## 2021-04-08 DIAGNOSIS — F325 Major depressive disorder, single episode, in full remission: Secondary | ICD-10-CM

## 2021-04-08 DIAGNOSIS — I825Z9 Chronic embolism and thrombosis of unspecified deep veins of unspecified distal lower extremity: Secondary | ICD-10-CM

## 2021-04-08 DIAGNOSIS — B351 Tinea unguium: Secondary | ICD-10-CM

## 2021-04-08 DIAGNOSIS — F5101 Primary insomnia: Secondary | ICD-10-CM | POA: Diagnosis not present

## 2021-04-08 DIAGNOSIS — Z7901 Long term (current) use of anticoagulants: Secondary | ICD-10-CM

## 2021-04-08 DIAGNOSIS — F0280 Dementia in other diseases classified elsewhere without behavioral disturbance: Secondary | ICD-10-CM

## 2021-04-08 DIAGNOSIS — Z5181 Encounter for therapeutic drug level monitoring: Secondary | ICD-10-CM | POA: Diagnosis not present

## 2021-04-08 DIAGNOSIS — G309 Alzheimer's disease, unspecified: Secondary | ICD-10-CM

## 2021-04-08 MED ORDER — TRAZODONE HCL 100 MG PO TABS: 100.0000 mg | ORAL_TABLET | Freq: Every day | ORAL | 3 refills | Status: DC

## 2021-04-08 MED ORDER — SIMVASTATIN 10 MG PO TABS: 10.0000 mg | ORAL_TABLET | Freq: Every day | ORAL | 3 refills | Status: DC

## 2021-04-08 MED ORDER — WARFARIN SODIUM 4 MG PO TABS: ORAL_TABLET | ORAL | 1 refills | Status: DC

## 2021-04-08 MED ORDER — WARFARIN SODIUM 3 MG PO TABS: ORAL_TABLET | ORAL | 1 refills | Status: DC

## 2021-04-08 NOTE — Telephone Encounter (Signed)
[name], fors are scheduled for a virtual visit with your provider today.    Just as we do with appointments in the office, we must obtain your consent to participate.  Your consent will be active for this visit and any virtual visit you may have with one of our providers in the next 365 days.    If you have a MyChart account, I can also send a copy of this consent to you electronically.  All virtual visits are billed to your insurance company just like a traditional visit in the office.  As this is a virtual visit, video technology does not allow for your provider to perform a traditional examination.  This may limit your provider's ability to fully assess your condition.  If your provider identifies any concerns that need to be evaluated in person or the need to arrange testing such as labs, EKG, etc, we will make arrangements to do so.    Although advances in technology are sophisticated, we cannot ensure that it will always work on either your end or our end.  If the connection with a video visit is poor, we may have to switch to a telephone visit.  With either a video or telephone visit, we are not always able to ensure that we have a secure connection.   I need to obtain your verbal consent now.   Are you willing to proceed with your visit today?   Faith Ortiz has provided verbal consent on 04/08/2021 for a virtual visit (video or telephone).   Elveria Royals, CMA 04/08/2021  1:37 PM  2

## 2021-04-08 NOTE — Progress Notes (Signed)
This service is provided via telemedicine  No vital signs collected/recorded due to the encounter was a telemedicine visit.   Location of patient (ex: home, work):  Home  Patient consents to a telephone visit:  Yes, see encounter dated 04/08/2021  Location of the provider (ex: office, home): [employer]  Name of any referring provider: Ngetich, Social research officer, government, NP  Names of all persons participating in the telemedicine service and their role in the encounter: Abbey Chatters, Nurse Practitioner, Elveria Royals, CMA, patient and Rickell, Daughter   Time spent on call:  9 minutes with medical assistant.

## 2021-04-08 NOTE — Progress Notes (Signed)
Careteam: Patient Care Team: Ngetich, Donalee Citrin, NP as PCP - General (Family Medicine)  Advanced Directive information    Allergies  Allergen Reactions  . Codeine Other (See Comments)    unknown  . Contrast Media [Iodinated Diagnostic Agents] Other (See Comments)    Weakness,   . Darvon [Propoxyphene] Other (See Comments)    Weakness, Fainting  . Wine [Alcohol] Other (See Comments)    Dizzy   . Penicillins Rash    Chief Complaint  Patient presents with  . Acute Visit    Patient needs to discuss medications. She was only given 4 tablets of Warfarin.Patient needs refill on medication.     HPI: Patient is a 87 y.o. female for routine follow up via mychart video visit. No concerns today. Needing refills.   She is in [place] for the summer, too hot in [place] She goes to [facility] for her INR  Hx of DVT continues on coumadin, has been on current dose for over a year. No bruising or bleeding. No swelling to leg.   She reports she needs a referral to a podiatrist to have her nails clipped.   Dementia- she lives with daughter- no changes in memory. She is still able to dress and bathe herself. Does not have a big appetite. Weight has remained stable.   Hyperlipidemia- simvastatin for cholesterol. LDL at goal in february    Review of Systems:  Review of Systems  Constitutional: Negative for chills, fever and weight loss.  HENT: Negative for tinnitus.   Respiratory: Negative for cough, sputum production and shortness of breath.   Cardiovascular: Negative for chest pain, palpitations and leg swelling.  Gastrointestinal: Negative for abdominal pain, constipation, diarrhea and heartburn.  Genitourinary: Negative for dysuria, frequency and urgency.  Musculoskeletal: Negative for back pain, joint pain and myalgias.  Skin: Negative.   Neurological: Negative for dizziness and headaches.  Psychiatric/Behavioral: Positive for memory loss. Negative for depression. The patient does  not have insomnia.     Past Medical History:  Diagnosis Date  . Alzheimer disease (HCC)   . Cataract 2011-2013   left  . Depression   . Mitral regurgitation   . TIA (transient ischemic attack)    Past Surgical History:  Procedure Laterality Date  . ABDOMINAL HYSTERECTOMY    . APPENDECTOMY    . BLADDER REPAIR    . CESAREAN SECTION    . CESAREAN SECTION    . CESAREAN SECTION    . TONSILLECTOMY     Social History:   reports that she has quit smoking. Her smoking use included cigarettes. She smoked 0.00 packs per day for 5.00 years. She has never used smokeless tobacco. She reports that she does not drink alcohol and does not use drugs.  Family History  Problem Relation Age of Onset  . Diabetes Father   . Arthritis Mother   . Breast cancer Sister   . Breast cancer Sister   . Stroke Sister   . Diabetes Brother   . Lung disease Brother   . Sarcoidosis Son 39  . Arthritis Son 2  . Gout Son 60  . Diabetes Daughter 65    Medications: Patient's Medications  New Prescriptions   No medications on file  Previous Medications   AMBULATORY NON FORMULARY MEDICATION    Please Draw PT/INR on ASAP Dx: Z51.81, I82.5Z9 and fax to Dr. Renato Gails @@ [facility] Fax#:[phone]   CALCIUM CITRATE-VITAMIN D (CALCIUM + D PO)    Take 1 tablet  by mouth every morning.   CYMBALTA 60 MG CAPSULE    Take 1 capsule (60 mg total) by mouth daily.   GABAPENTIN (NEURONTIN) 100 MG CAPSULE    Take 1 capsule (100 mg total) by mouth 3 (three) times daily.   MEMANTINE (NAMENDA) 10 MG TABLET    Take 1 tablet (10 mg total) by mouth 2 (two) times daily.   POTASSIUM CHLORIDE (KLOR-CON) 8 MEQ TABLET    Take 1 tablet (8 mEq total) by mouth daily.   SIMVASTATIN (ZOCOR) 10 MG TABLET    Take 1 tablet (10 mg total) by mouth daily.   TETANUS & DIPHTHERIA TOXOIDS, ADULT, (TENIVAC) 5-2 LFU INJECTION    Inject 0.5 mLs into the muscle once.   TRAZODONE (DESYREL) 100 MG TABLET    Take 1 tablet (100 mg total) by mouth  at bedtime.   WARFARIN (COUMADIN) 3 MG TABLET    TAKE 1 TABLET BY MOUTH ON MONDAY,WEDNESDAY , FRIDAY AND SUNDAY   WARFARIN (COUMADIN) 4 MG TABLET    Take 1 by mouth on Tuesday, Thursday, and Saturday APPOINTMENT OVERDUE  Modified Medications   No medications on file  Discontinued Medications   No medications on file    Physical Exam:  There were no vitals filed for this visit. There is no height or weight on file to calculate BMI. Wt Readings from Last 3 Encounters:  01/20/21 111 lb 12.8 oz (50.7 kg)  10/18/19 128 lb (58.1 kg)  09/25/19 131 lb 6.4 oz (59.6 kg)      Labs reviewed: Basic Metabolic Panel: Recent Labs    01/20/21 1251  NA 138  K 4.2  CL 106  CO2 25  GLUCOSE 112*  BUN 28*  CREATININE 0.75  CALCIUM 9.5   Liver Function Tests: Recent Labs    01/20/21 1251  AST 22  ALT 12  BILITOT 0.6  PROT 6.8   No results for input(s): LIPASE, AMYLASE in the last 8760 hours. No results for input(s): AMMONIA in the last 8760 hours. CBC: Recent Labs    01/20/21 1251  WBC 3.7*  NEUTROABS 1,913  HGB 14.0  HCT 41.1  MCV 89.5  PLT 192   Lipid Panel: Recent Labs    01/20/21 1251  CHOL 178  HDL 64  LDLCALC 95  TRIG 95  CHOLHDL 2.8   TSH: No results for input(s): TSH in the last 8760 hours. A1C: Lab Results  Component Value Date   HGBA1C 5.5 04/12/2017     Assessment/Plan 1. Primary insomnia -well controlled on trazodone.  - traZODone (DESYREL) 100 MG tablet; Take 1 tablet (100 mg total) by mouth at bedtime.  Dispense: 90 tablet; Refill: 3  2. Chronic deep vein thrombosis (DVT) of distal vein of lower extremity, unspecified laterality (HCC) -without signs of bleeding or bruising, shortness of breath, chest pains, no LE edema.  -INR being checked at lab and sent to [facility]. Scheduled for every 4 weeks.  - warfarin (COUMADIN) 4 MG tablet; Take 1 by mouth on Tuesday, Thursday, and Saturday  Dispense: 90 tablet; Refill: 1 - warfarin (COUMADIN) 3 MG  tablet; Daily by mouth on Monday, Wednesday, Friday and Sunday  Dispense: 90 tablet; Refill: 1  3. Alzheimer disease (HCC) -without acute changes in cognitive or functional change. Continues on namenda twice daily.   4. Anticoagulation goal of INR 2 to 3 -continues on coumadin as prescribed.  Lab Results  Component Value Date   INR 1.9 (H) 03/20/2021   INR 1.5 (A)  01/20/2021   INR 2.9 (H) 12/24/2020   PROTIME 19.4 (A) 06/10/2020   PROTIME 20.8 (A) 09/01/2018   PROTIME 25.4 (A) 06/30/2018     6. Hyperlipidemia, unspecified hyperlipidemia type -LDL at goal.  - simvastatin (ZOCOR) 10 MG tablet; Take 1 tablet (10 mg total) by mouth daily.  Dispense: 90 tablet; Refill: 3  7. Major depression in remission (HCC) -continues on Cymbalta   8. Onychomycosis -needs someone to trim toenails. Plans to look for local podiatrist will notify if she needs referral.   Next appt: 3 months with labs at appt. Janene Harvey. Biagio Borg  [facility] [phone]    Virtual Visit via Earleen Reaper  I connected with patient on 04/08/21 at  1:30 PM EDT by video and verified that I am speaking with the correct person using two identifiers.  Location: Patient: home Provider: [place]   I discussed the limitations, risks, security and privacy concerns of performing an evaluation and management service by telephone and the availability of in person appointments. I also discussed with the patient that there may be a patient responsible charge related to this service. The patient expressed understanding and agreed to proceed.   I discussed the assessment and treatment plan with the patient. The patient was provided an opportunity to ask questions and all were answered. The patient agreed with the plan and demonstrated an understanding of the instructions.   The patient was advised to call back or seek an in-person evaluation if the symptoms worsen or if the condition fails to  improve as anticipated.  I provided 25 minutes of non-face-to-face time during this encounter.  Janene Harvey. Biagio Borg Avs printed and mailed

## 2021-05-07 ENCOUNTER — Other Ambulatory Visit: Payer: Self-pay | Admitting: *Deleted

## 2021-05-07 DIAGNOSIS — I825Z9 Chronic embolism and thrombosis of unspecified deep veins of unspecified distal lower extremity: Secondary | ICD-10-CM

## 2021-05-07 MED ORDER — WARFARIN SODIUM 3 MG PO TABS: ORAL_TABLET | ORAL | 1 refills | Status: DC

## 2021-05-07 NOTE — Telephone Encounter (Signed)
Pharmacy requested refill

## 2021-05-14 ENCOUNTER — Other Ambulatory Visit: Payer: Self-pay | Admitting: *Deleted

## 2021-05-14 MED ORDER — MEMANTINE HCL 10 MG PO TABS: 10.0000 mg | ORAL_TABLET | Freq: Two times a day (BID) | ORAL | 1 refills | Status: DC

## 2021-05-14 NOTE — Telephone Encounter (Signed)
[facility] requested refill.  ?

## 2021-05-23 ENCOUNTER — Other Ambulatory Visit: Payer: Self-pay | Admitting: Family

## 2021-05-23 DIAGNOSIS — I825Z9 Chronic embolism and thrombosis of unspecified deep veins of unspecified distal lower extremity: Secondary | ICD-10-CM

## 2021-05-23 MED ORDER — WARFARIN SODIUM 4 MG PO TABS: ORAL_TABLET | ORAL | 1 refills | Status: DC

## 2021-05-23 NOTE — Progress Notes (Signed)
Patient's daughter Ronna Herskowitz call request Warfarin 4 mg tablet to be [facility] [place]  Script send to pharmacy per request.

## 2021-06-03 ENCOUNTER — Telehealth: Payer: Self-pay | Admitting: Family

## 2021-06-03 ENCOUNTER — Other Ambulatory Visit: Payer: Self-pay

## 2021-06-03 DIAGNOSIS — Z5181 Encounter for therapeutic drug level monitoring: Secondary | ICD-10-CM

## 2021-06-03 NOTE — Telephone Encounter (Signed)
Called patient and lab results were discussed and understood with patient daughter "Raeya Merritts". Patient INR placed as future order. Patient scheduled for Lab INR check 07/04/2021.

## 2021-06-03 NOTE — Telephone Encounter (Signed)
Patient INR results received from [facility] INR 2.8  Advise patient to continue on Warfarin 3 mg tablet on Mon,wed,Fri and Sunday and Warfarin 4 mg tablet on Tue,Thur and Sat  Recheck INR in 1 month.

## 2021-07-04 ENCOUNTER — Other Ambulatory Visit: Payer: Self-pay

## 2021-07-24 ENCOUNTER — Ambulatory Visit: Payer: Federal, State, Local not specified - PPO | Admitting: Family

## 2021-07-24 ENCOUNTER — Ambulatory Visit: Payer: Federal, State, Local not specified - PPO | Admitting: Internal Medicine

## 2021-08-12 ENCOUNTER — Other Ambulatory Visit: Payer: Self-pay | Admitting: Family

## 2021-08-19 ENCOUNTER — Other Ambulatory Visit: Payer: Self-pay | Admitting: *Deleted

## 2021-08-19 MED ORDER — CYMBALTA 60 MG PO CPEP: 60.0000 mg | ORAL_CAPSULE | Freq: Every day | ORAL | 0 refills | Status: DC

## 2021-08-19 NOTE — Telephone Encounter (Signed)
Spoke with Faith Ortiz and she stated that they will call and make an appointment.

## 2021-08-19 NOTE — Telephone Encounter (Signed)
Refill provided for 30 day supply needs to make follow up appt with Carilyn Goodpasture, PCP

## 2021-08-19 NOTE — Telephone Encounter (Signed)
Patient daughter called requesting refill.  Pended Rx and sent to [facility] for approval due to HIGH ALERT Warning and Dinah out of office and patient is needing due to almost out.

## 2021-09-02 ENCOUNTER — Ambulatory Visit: Payer: Federal, State, Local not specified - PPO | Admitting: Family Medicine

## 2021-09-02 ENCOUNTER — Other Ambulatory Visit: Payer: Self-pay

## 2021-09-02 ENCOUNTER — Encounter: Payer: Self-pay | Admitting: Family Medicine

## 2021-09-02 VITALS — BP 118/76 | HR 83 | Temp 98.6°F | Ht 64.0 in | Wt 117.4 lb

## 2021-09-02 DIAGNOSIS — F5101 Primary insomnia: Secondary | ICD-10-CM

## 2021-09-02 DIAGNOSIS — R002 Palpitations: Secondary | ICD-10-CM | POA: Insufficient documentation

## 2021-09-02 DIAGNOSIS — I4891 Unspecified atrial fibrillation: Secondary | ICD-10-CM | POA: Insufficient documentation

## 2021-09-02 DIAGNOSIS — I825Z9 Chronic embolism and thrombosis of unspecified deep veins of unspecified distal lower extremity: Secondary | ICD-10-CM | POA: Diagnosis not present

## 2021-09-02 DIAGNOSIS — I482 Chronic atrial fibrillation, unspecified: Secondary | ICD-10-CM

## 2021-09-02 DIAGNOSIS — G301 Alzheimer's disease with late onset: Secondary | ICD-10-CM | POA: Diagnosis not present

## 2021-09-02 DIAGNOSIS — G309 Alzheimer's disease, unspecified: Secondary | ICD-10-CM

## 2021-09-02 DIAGNOSIS — F0280 Dementia in other diseases classified elsewhere without behavioral disturbance: Secondary | ICD-10-CM

## 2021-09-02 LAB — POCT INR
INR: 3.8 — AB (ref 2.0–3.0)
PT: 45.7

## 2021-09-02 MED ORDER — METOPROLOL TARTRATE 25 MG PO TABS: 12.5000 mg | ORAL_TABLET | Freq: Two times a day (BID) | ORAL | 3 refills | Status: DC

## 2021-09-02 MED ORDER — MEMANTINE HCL 10 MG PO TABS: 10.0000 mg | ORAL_TABLET | Freq: Two times a day (BID) | ORAL | 1 refills | Status: DC

## 2021-09-02 MED ORDER — TRAZODONE HCL 100 MG PO TABS: 100.0000 mg | ORAL_TABLET | Freq: Every day | ORAL | 3 refills | Status: DC

## 2021-09-02 NOTE — Progress Notes (Signed)
Provider:  Jacalyn Lefevre, MD  Careteam: Patient Care Team: Ngetich, Donalee Citrin, NP as PCP - General (Family Medicine)  PLACE OF SERVICE:  [facility]  Advanced Directive information    Allergies  Allergen Reactions   Codeine Other (See Comments)    unknown   Contrast Media [Iodinated Diagnostic Agents] Other (See Comments)    Weakness,    Darvon [Propoxyphene] Other (See Comments)    Weakness, Fainting   Wine [Alcohol] Other (See Comments)    Dizzy    Penicillins Rash    No chief complaint on file.    HPI: Patient is a 87 y.o. female .  She is here today to check her PT/INR which is elevated at 3.8.  She has been taking 3 mg Coumadin on Monday Wednesday and Friday and 4 mg the other days of the week.  She is on Coumadin because she had a "mini stroke" some 20 years ago.  There is no history of DVT PE or A. fib. She continues to take Namenda for memory.  Memory has not changed.  She also takes simvastatin for many years.  She denies myalgias. Also takes Cymbalta for depression and trazodone 100 mg at bedtime for sleep.  Review of Systems:  Review of Systems  Respiratory: Negative.    Cardiovascular:  Positive for palpitations. Negative for chest pain.  Gastrointestinal: Negative.   Musculoskeletal: Negative.   All other systems reviewed and are negative.  Past Medical History:  Diagnosis Date   Alzheimer disease (HCC)    Cataract 2011-2013   left   Depression    Mitral regurgitation    TIA (transient ischemic attack)    Past Surgical History:  Procedure Laterality Date   ABDOMINAL HYSTERECTOMY     APPENDECTOMY     BLADDER REPAIR     CESAREAN SECTION     CESAREAN SECTION     CESAREAN SECTION     TONSILLECTOMY     Social History:   reports that she has quit smoking. Her smoking use included cigarettes. She has never used smokeless tobacco. She reports that she does not drink alcohol and does not use drugs.  Family History  Problem Relation Age of Onset    Diabetes Father    Arthritis Mother    Breast cancer Sister    Breast cancer Sister    Stroke Sister    Diabetes Brother    Lung disease Brother    Sarcoidosis Son 68   Arthritis Son 68   Gout Son 23   Diabetes Daughter 47    Medications: Patient's Medications  New Prescriptions   No medications on file  Previous Medications   AMBULATORY NON FORMULARY MEDICATION    Please Draw PT/INR on ASAP Dx: Z51.81, I82.5Z9 and fax to Dr. Renato Gails @@ [facility] Fax#:[phone]   CALCIUM CITRATE-VITAMIN D (CALCIUM + D PO)    Take 1 tablet by mouth every morning.   CYMBALTA 60 MG CAPSULE    Take 1 capsule (60 mg total) by mouth daily.   MEMANTINE (NAMENDA) 10 MG TABLET    Take 1 tablet (10 mg total) by mouth 2 (two) times daily.   POTASSIUM CHLORIDE (KLOR-CON) 8 MEQ TABLET    Take 1 tablet (8 mEq total) by mouth daily. NEEDS APPT FOR 90 DAY SUPPLY AND ADDITIONAL REFILLS   SIMVASTATIN (ZOCOR) 10 MG TABLET    Take 1 tablet (10 mg total) by mouth daily.   TETANUS & DIPHTHERIA TOXOIDS, ADULT, (TENIVAC) 5-2  LFU INJECTION    Inject 0.5 mLs into the muscle once.   TRAZODONE (DESYREL) 100 MG TABLET    Take 1 tablet (100 mg total) by mouth at bedtime.   WARFARIN (COUMADIN) 3 MG TABLET    Daily by mouth on Monday, Wednesday, Friday and Sunday   WARFARIN (COUMADIN) 4 MG TABLET    Take 1 by mouth on Tuesday, Thursday, and Saturday  Modified Medications   No medications on file  Discontinued Medications   No medications on file    Physical Exam:  Vitals:   09/02/21 1515  BP: 118/76  Pulse: 83  Temp: 98.6 F (37 C)  SpO2: 96%  Weight: 117 lb 6.4 oz (53.3 kg)  Height: 5\' 4"  (1.626 m)   Body mass index is 20.15 kg/m. Wt Readings from Last 3 Encounters:  09/02/21 117 lb 6.4 oz (53.3 kg)  01/20/21 111 lb 12.8 oz (50.7 kg)  10/18/19 128 lb (58.1 kg)    Physical Exam Vitals and nursing note reviewed.  Constitutional:      Appearance: Normal appearance.  Cardiovascular:     Rate and  Rhythm: Tachycardia present. Rhythm irregular.  Pulmonary:     Effort: Pulmonary effort is normal.     Breath sounds: Normal breath sounds.  Abdominal:     General: Abdomen is flat.     Palpations: Abdomen is soft.  Neurological:     Mental Status: She is alert and oriented to person, place, and time.     Comments: Cannot remember what she ate for supper last night.  Is oriented x3.  Psychiatric:        Mood and Affect: Mood normal.        Behavior: Behavior normal.    Labs reviewed: Basic Metabolic Panel: Recent Labs    01/20/21 1251  NA 138  K 4.2  CL 106  CO2 25  GLUCOSE 112*  BUN 28*  CREATININE 0.75  CALCIUM 9.5   Liver Function Tests: Recent Labs    01/20/21 1251  AST 22  ALT 12  BILITOT 0.6  PROT 6.8   No results for input(s): LIPASE, AMYLASE in the last 8760 hours. No results for input(s): AMMONIA in the last 8760 hours. CBC: Recent Labs    01/20/21 1251  WBC 3.7*  NEUTROABS 1,913  HGB 14.0  HCT 41.1  MCV 89.5  PLT 192   Lipid Panel: Recent Labs    01/20/21 1251  CHOL 178  HDL 64  LDLCALC 95  TRIG 95  CHOLHDL 2.8   TSH: No results for input(s): TSH in the last 8760 hours. A1C: Lab Results  Component Value Date   HGBA1C 5.5 04/12/2017     Assessment/Plan 1. Chronic deep vein thrombosis (DVT) of distal vein of lower extremity, unspecified laterality [facility]) Daughter says that she is taking Coumadin for old stroke rather than DVT so that history is a bit muddy.  She will continue with Coumadin nevertheless since she now has A. fib - POCT INR  2. Palpitations A. fib I suspect - EKG 12-Lead  3. Heart palpitations EKG confirms atrial fibrillation.  We will continue with Coumadin and begin metoprolol 12.5 mg twice daily with follow-up instructions to see cardiology.  She is headed back to [facility]  4. Primary insomnia Continue with trazodone 100 mg at bedtime   6. Chronic atrial fibrillation (HCC) We have no idea when this  started.  She has been on Coumadin - metoprolol tartrate (LOPRESSOR) 25 MG tablet; Take 0.5  tablets (12.5 mg total) by mouth 2 (two) times daily.  Dispense: 90 tablet; Refill: 3  7. Alzheimer disease (HCC) Continue Namenda as before.  There are no behavioral issues    Jacalyn Lefevre, MD [facility] [phone]

## 2021-09-02 NOTE — Patient Instructions (Addendum)
Continue Coumadin on the schedule: Take 3 mg on Sunday, Tuesday, Wednesday, Thursday, and Saturday.  Take 4 mg Coumadin on Monday and Friday only Begin metoprolol 12.5 mg twice a day to control heart rate Please follow-up with cardiologist

## 2021-09-03 ENCOUNTER — Telehealth: Payer: Self-pay | Admitting: *Deleted

## 2021-09-03 NOTE — Telephone Encounter (Signed)
Patient daughter, [place], called and stated that her mother was seen yesterday.  Daughter stated that in the past when she has had INR's done in the office the machine has always been off. Stated that her reading was abnormal yesterday.   Daughter is wanting to know if she can bring patient in and do Labs for INR instead.   Please Advise.

## 2021-09-03 NOTE — Telephone Encounter (Signed)
Sure but I have compared offive vs lab and have not seen difference enough to warrant lab Received: Today Faith Kuster, MD  Chester Holstein, CMA Caller: Unspecified (Today, 11:39 AM)

## 2021-09-04 ENCOUNTER — Other Ambulatory Visit: Payer: Self-pay | Admitting: *Deleted

## 2021-09-04 ENCOUNTER — Ambulatory Visit: Payer: Federal, State, Local not specified - PPO | Admitting: Family

## 2021-09-04 MED ORDER — CYMBALTA 60 MG PO CPEP: 60.0000 mg | ORAL_CAPSULE | Freq: Every day | ORAL | 0 refills | Status: DC

## 2021-09-04 NOTE — Telephone Encounter (Signed)
Faith Ortiz, daughter, notified and stated that she really likes Dr. Hyacinth Meeker and appreciates his Advice and will hold off on getting labs done at this time.  Stated her mother really enjoyed Dr. Hyacinth Meeker at her visit.

## 2021-09-04 NOTE — Telephone Encounter (Signed)
Patient daughter called and stated that they left Rx's in [place] and needs #6 until they get back. Patient was seen by Dr. Hyacinth Meeker in office this week.   Pended Rx and sent to [facility] for approval due to HIGH ALERT Warning.

## 2021-09-08 ENCOUNTER — Other Ambulatory Visit: Payer: Self-pay | Admitting: *Deleted

## 2021-09-08 DIAGNOSIS — I825Z9 Chronic embolism and thrombosis of unspecified deep veins of unspecified distal lower extremity: Secondary | ICD-10-CM

## 2021-09-08 DIAGNOSIS — E785 Hyperlipidemia, unspecified: Secondary | ICD-10-CM

## 2021-09-08 MED ORDER — WARFARIN SODIUM 4 MG PO TABS
ORAL_TABLET | ORAL | 0 refills | Status: DC
Start: 2021-09-08 — End: 2021-10-13

## 2021-09-08 MED ORDER — WARFARIN SODIUM 3 MG PO TABS: ORAL_TABLET | ORAL | 0 refills | Status: DC

## 2021-09-08 MED ORDER — CYMBALTA 60 MG PO CPEP: 60.0000 mg | ORAL_CAPSULE | Freq: Every day | ORAL | 0 refills | Status: DC

## 2021-09-08 MED ORDER — SIMVASTATIN 10 MG PO TABS: 10.0000 mg | ORAL_TABLET | Freq: Every day | ORAL | 0 refills | Status: DC

## 2021-09-08 MED ORDER — POTASSIUM CHLORIDE ER 8 MEQ PO TBCR: 8.0000 meq | EXTENDED_RELEASE_TABLET | Freq: Every day | ORAL | 0 refills | Status: DC

## 2021-09-08 NOTE — Telephone Encounter (Signed)
Patient daughter, Jocelyn, called and stated that they are on their way back to [place] and they had to stop in [place] for daughter to be seen by Neurologist. Daughter was late for the appointment and the Dr. Left without seeing her. Daughter had to stay an extra night in [place] and does not have enough medication for patient and needs #2 pills calling into pharmacy in [place].

## 2021-09-26 ENCOUNTER — Telehealth: Payer: Self-pay

## 2021-09-26 NOTE — Telephone Encounter (Signed)
Patient's daughter called stating that Dr. Bertram Millard. Faith Ortiz wanted her mother to see a cardiologist. Patient is back in [place] and needs something in writing faxed to [facility] stating that he is referring her and why. Last note faxed. Fax [phone]

## 2021-09-30 ENCOUNTER — Other Ambulatory Visit: Payer: Self-pay | Admitting: *Deleted

## 2021-09-30 MED ORDER — CYMBALTA 60 MG PO CPEP: 60.0000 mg | ORAL_CAPSULE | Freq: Every day | ORAL | 1 refills | Status: DC

## 2021-09-30 NOTE — Telephone Encounter (Signed)
Patient daughter requested refill to be sent to [facility].  Pended Rx and sent to [facility] for approval due to HIGH ALERT Warning.

## 2021-10-01 MED ORDER — DULOXETINE HCL 60 MG PO CPEP: 60.0000 mg | ORAL_CAPSULE | Freq: Every day | ORAL | 0 refills | Status: DC

## 2021-10-01 NOTE — Addendum Note (Signed)
Addended by: Nelda Severe A on: 10/01/2021 08:34 AM   Modules accepted: Orders

## 2021-10-06 ENCOUNTER — Other Ambulatory Visit: Payer: Self-pay | Admitting: *Deleted

## 2021-10-06 MED ORDER — DULOXETINE HCL 60 MG PO CPEP: 60.0000 mg | ORAL_CAPSULE | Freq: Every day | ORAL | 0 refills | Status: DC

## 2021-10-06 NOTE — Telephone Encounter (Signed)
Patient is awaiting her mail order Rx and needs a small supply sent to pharmacy until she receives it through the Mail.   Pended Rx and sent to [facility] for approval due to HIGH ALERT Warning.

## 2021-10-08 ENCOUNTER — Other Ambulatory Visit: Payer: Self-pay | Admitting: Nurse Practitioner

## 2021-10-08 NOTE — Telephone Encounter (Signed)
[facility] requested refill.  Pended Rx and sent to [facility] for approval due to HIGH ALERT Warning.

## 2021-10-13 ENCOUNTER — Other Ambulatory Visit: Payer: Self-pay | Admitting: Family

## 2021-10-13 DIAGNOSIS — I825Z9 Chronic embolism and thrombosis of unspecified deep veins of unspecified distal lower extremity: Secondary | ICD-10-CM

## 2021-10-13 NOTE — Telephone Encounter (Signed)
Patient has request refill on medication "Potassium" and "Warfarin". Patient medications last refilled on 09/08/2021. Patient was given 2 tablets for some reason. I tried to send medication into pharmacy but many High Risk Warnings appeared. Medication pend and sent to Abbey Chatters, NP due to PCP Ngetich, Donalee Citrin, NP being out of office.

## 2021-12-03 ENCOUNTER — Telehealth: Payer: Self-pay | Admitting: *Deleted

## 2021-12-03 NOTE — Telephone Encounter (Signed)
Patient daughter called and stated that she wanted to let you know that patient's Cardiologist took patient off of Warfarin and placed her on Eliquis 2.5mg  by mouth every 12 hours. Patient has been taking for about 3 month now and doing fine.   Daughter is wanting the Warfarin removed from patient's chart and the Eliquis added.   Medication list updated.   FYI

## 2021-12-03 NOTE — Telephone Encounter (Signed)
Update medication list per patient's daughter's request.

## 2022-03-02 ENCOUNTER — Other Ambulatory Visit: Payer: Self-pay

## 2022-03-02 ENCOUNTER — Other Ambulatory Visit: Payer: Self-pay | Admitting: Family

## 2022-03-02 DIAGNOSIS — F0280 Dementia in other diseases classified elsewhere without behavioral disturbance: Secondary | ICD-10-CM

## 2022-03-02 MED ORDER — MEMANTINE HCL 10 MG PO TABS: 10.0000 mg | ORAL_TABLET | Freq: Two times a day (BID) | ORAL | 1 refills | Status: DC

## 2022-03-02 MED ORDER — MEMANTINE HCL 10 MG PO TABS: 10.0000 mg | ORAL_TABLET | Freq: Two times a day (BID) | ORAL | 0 refills | Status: DC

## 2022-03-02 NOTE — Telephone Encounter (Signed)
Patient called requesting a refill for her Namenda 10 MG. She states that she needs [facility] [place]. I had send a refill request to [facility] order [facility] for patient. ?

## 2022-04-24 ENCOUNTER — Other Ambulatory Visit: Payer: Self-pay | Admitting: *Deleted

## 2022-04-24 MED ORDER — CYMBALTA 60 MG PO CPEP: 60.0000 mg | ORAL_CAPSULE | Freq: Every day | ORAL | 0 refills | Status: DC

## 2022-04-24 NOTE — Telephone Encounter (Signed)
Patient daughter called requesting Refill.  Patient needs an appointment before anymore future refills. Patient daughter stated that they are going to be back in [place] in July and will schedule an appointment in July to be seen.   Pended Rx and sent to [facility] for approval due to HIGH ALERT Warning.

## 2022-04-28 ENCOUNTER — Other Ambulatory Visit: Payer: Self-pay | Admitting: *Deleted

## 2022-04-28 MED ORDER — DULOXETINE HCL 60 MG PO CPEP: 60.0000 mg | ORAL_CAPSULE | Freq: Every day | ORAL | 0 refills | Status: DC

## 2022-04-28 NOTE — Telephone Encounter (Signed)
Patient daughter called and stated that She doesn't want the "Brand" anymore because it is hard to receive the Rx.   [facility] APPROVED the Rx on Friday but the Rx had Brand Only and patient could not get the Rx. Due to out of stock. Needing the Generic sent to pharmacy[facility].   Daughter is requesting a short supply to be sent to a local pharmacy also because patient is out.

## 2022-04-29 ENCOUNTER — Other Ambulatory Visit: Payer: Self-pay | Admitting: Nurse Practitioner

## 2022-04-29 DIAGNOSIS — E785 Hyperlipidemia, unspecified: Secondary | ICD-10-CM

## 2022-05-08 ENCOUNTER — Other Ambulatory Visit: Payer: Self-pay | Admitting: Family

## 2022-05-25 ENCOUNTER — Other Ambulatory Visit: Payer: Self-pay | Admitting: Family

## 2022-05-25 DIAGNOSIS — E785 Hyperlipidemia, unspecified: Secondary | ICD-10-CM

## 2022-06-17 ENCOUNTER — Ambulatory Visit: Payer: Self-pay | Admitting: Family Medicine

## 2022-06-25 ENCOUNTER — Other Ambulatory Visit: Payer: Self-pay | Admitting: Family

## 2022-06-25 DIAGNOSIS — E785 Hyperlipidemia, unspecified: Secondary | ICD-10-CM

## 2022-06-30 ENCOUNTER — Telehealth: Payer: Self-pay | Admitting: Family

## 2022-06-30 ENCOUNTER — Other Ambulatory Visit: Payer: Self-pay | Admitting: Nurse Practitioner

## 2022-06-30 DIAGNOSIS — F5101 Primary insomnia: Secondary | ICD-10-CM

## 2022-06-30 NOTE — Telephone Encounter (Signed)
High risk or very high risk warning populated when attempting to refill medication. RX request sent to PCP for review and approval if warranted.   

## 2022-06-30 NOTE — Telephone Encounter (Signed)
Note received from pharmacy indicating possible noncompliance with medication Simvastatin 10 mg tablet last filled 01/26/2022 please verify with patient if still taking medication.also has no follow up appointment.

## 2022-06-30 NOTE — Telephone Encounter (Signed)
Tried calling daughter, LMOM to return call.

## 2022-07-09 ENCOUNTER — Other Ambulatory Visit: Payer: Self-pay | Admitting: Family

## 2022-07-09 DIAGNOSIS — E785 Hyperlipidemia, unspecified: Secondary | ICD-10-CM

## 2022-07-13 ENCOUNTER — Other Ambulatory Visit: Payer: Self-pay | Admitting: *Deleted

## 2022-07-13 DIAGNOSIS — F5101 Primary insomnia: Secondary | ICD-10-CM

## 2022-07-13 MED ORDER — TRAZODONE HCL 100 MG PO TABS: 100.0000 mg | ORAL_TABLET | Freq: Every day | ORAL | 0 refills | Status: DC

## 2022-07-13 NOTE — Telephone Encounter (Signed)
Patient daughter called requesting Rx.   Patient has In Office appointment Next week with Dr. Hyacinth Meeker and Confirmed this with daughter.   Pended Rx for #15 and sent to [facility] for approval due to HIGH ALERT Warning.

## 2022-07-14 ENCOUNTER — Other Ambulatory Visit: Payer: Self-pay | Admitting: Family

## 2022-07-14 DIAGNOSIS — F5101 Primary insomnia: Secondary | ICD-10-CM

## 2022-07-20 ENCOUNTER — Ambulatory Visit: Payer: Self-pay | Admitting: Family

## 2022-07-22 ENCOUNTER — Ambulatory Visit: Payer: Self-pay | Admitting: Family Medicine

## 2022-07-29 ENCOUNTER — Encounter: Payer: Self-pay | Admitting: Family

## 2022-08-05 ENCOUNTER — Ambulatory Visit (INDEPENDENT_AMBULATORY_CARE_PROVIDER_SITE_OTHER): Payer: Federal, State, Local not specified - PPO | Admitting: Family Medicine

## 2022-08-05 ENCOUNTER — Encounter: Payer: Self-pay | Admitting: Family Medicine

## 2022-08-05 VITALS — BP 110/60 | HR 64 | Temp 97.5°F | Ht 64.0 in | Wt 117.2 lb

## 2022-08-05 DIAGNOSIS — I482 Chronic atrial fibrillation, unspecified: Secondary | ICD-10-CM | POA: Diagnosis not present

## 2022-08-05 DIAGNOSIS — E785 Hyperlipidemia, unspecified: Secondary | ICD-10-CM

## 2022-08-05 DIAGNOSIS — G309 Alzheimer's disease, unspecified: Secondary | ICD-10-CM

## 2022-08-05 DIAGNOSIS — F0280 Dementia in other diseases classified elsewhere without behavioral disturbance: Secondary | ICD-10-CM

## 2022-08-05 NOTE — Progress Notes (Signed)
Provider:  Jacalyn Lefevre, MD  Careteam: Patient Care Team: Ngetich, Donalee Citrin, NP as PCP - General (Family Medicine)  PLACE OF SERVICE:  [facility]  Advanced Directive information    Allergies  Allergen Reactions   Codeine Other (See Comments)    unknown   Contrast Media [Iodinated Contrast Media] Other (See Comments)    Weakness,    Darvon [Propoxyphene] Other (See Comments)    Weakness, Fainting   Wine [Alcohol] Other (See Comments)    Dizzy    Penicillins Rash    Chief Complaint  Patient presents with   Medical Management of Chronic Issues    Patient presents today for a 11 month follow-up.   Quality Metric Gaps    Zoster, COVID #4     HPI: Patient is a 88 y.o. female .  Last saw her 11 months ago.  She spends much of her time in [place].  At her last visit she was noted to have A-fib and started on metoprolol.  Cardiology continuing on discontinued metoprolol as daughter thinks that that affected her energy. Compared to 11 months ago weight and blood pressure are about the same.  Daughter says that her memory has certainly declined over that time.  She does a lot more sleeping.  There have been no falls. She continues to take Eliquis 2.5 mg twice daily, Cymbalta 60 mg a day Namenda 10 mg twice daily, and trazodone 100 mg at bedtime  Review of Systems:  Review of Systems  Constitutional:  Negative for malaise/fatigue and weight loss.  Eyes: Negative.   Respiratory: Negative.    Cardiovascular: Negative.   Genitourinary: Negative.   Musculoskeletal: Negative.   Neurological: Negative.   Psychiatric/Behavioral:  Positive for memory loss. The patient has insomnia.   All other systems reviewed and are negative.   Past Medical History:  Diagnosis Date   Alzheimer disease (HCC)    Cataract 2011-2013   left   Depression    Mitral regurgitation    TIA (transient ischemic attack)    Past Surgical History:  Procedure Laterality Date   ABDOMINAL  HYSTERECTOMY     APPENDECTOMY     BLADDER REPAIR     CESAREAN SECTION     CESAREAN SECTION     CESAREAN SECTION     TONSILLECTOMY     Social History:   reports that she has quit smoking. Her smoking use included cigarettes. She has never used smokeless tobacco. She reports that she does not drink alcohol and does not use drugs.  Family History  Problem Relation Age of Onset   Diabetes Father    Arthritis Mother    Breast cancer Sister    Breast cancer Sister    Stroke Sister    Diabetes Brother    Lung disease Brother    Sarcoidosis Son 41   Arthritis Son 90   Gout Son 50   Diabetes Daughter 68    Medications: Patient's Medications  New Prescriptions   No medications on file  Previous Medications   AMBULATORY NON FORMULARY MEDICATION    Please Draw PT/INR on ASAP Dx: Z51.81, I82.5Z9 and fax to Dr. Renato Gails @@ [facility] Fax#:[phone]   CALCIUM CITRATE-VITAMIN D (CALCIUM + D PO)    Take 1 tablet by mouth every morning.   DULOXETINE (CYMBALTA) 60 MG CAPSULE    Take 1 capsule (60 mg total) by mouth daily. Needs an appointment before anymore Future Refills.   ELIQUIS 2.5 MG TABS  TABLET    Take 2.5 mg by mouth 2 (two) times daily.   MEMANTINE (NAMENDA) 10 MG TABLET    Take 1 tablet (10 mg total) by mouth 2 (two) times daily.   POTASSIUM CHLORIDE (KLOR-CON) 8 MEQ TABLET    TAKE 1 TABLET BY MOUTH EVERY DAY   SIMVASTATIN (ZOCOR) 10 MG TABLET    TAKE 1 TABLET BY MOUTH EVERY DAY   TRAZODONE (DESYREL) 100 MG TABLET    Take 1 tablet (100 mg total) by mouth at bedtime.  Modified Medications   No medications on file  Discontinued Medications   METOPROLOL TARTRATE (LOPRESSOR) 25 MG TABLET    Take 0.5 tablets (12.5 mg total) by mouth 2 (two) times daily.   TETANUS & DIPHTHERIA TOXOIDS, ADULT, (TENIVAC) 5-2 LFU INJECTION    Inject 0.5 mLs into the muscle once.    Physical Exam:  Vitals:   08/05/22 1440  BP: 110/60  Pulse: 64  Temp: (!) 97.5 F (36.4 C)  SpO2: 98%   Weight: 117 lb 3.2 oz (53.2 kg)  Height: 5\' 4"  (1.626 m)   Body mass index is 20.12 kg/m. Wt Readings from Last 3 Encounters:  08/05/22 117 lb 3.2 oz (53.2 kg)  09/02/21 117 lb 6.4 oz (53.3 kg)  01/20/21 111 lb 12.8 oz (50.7 kg)    Physical Exam Vitals and nursing note reviewed.  Constitutional:      Comments: Patient does very little speaking but answers questions when directly asked  Cardiovascular:     Rate and Rhythm: Normal rate. Rhythm irregular.  Pulmonary:     Effort: Pulmonary effort is normal.     Breath sounds: Normal breath sounds.  Musculoskeletal:        General: Normal range of motion.     Comments: Gait appears stable  Neurological:     General: No focal deficit present.     Mental Status: She is alert.     Comments: Oriented to person but not place or time     Labs reviewed: Basic Metabolic Panel: No results for input(s): "NA", "K", "CL", "CO2", "GLUCOSE", "BUN", "CREATININE", "CALCIUM", "MG", "PHOS", "TSH" in the last 8760 hours. Liver Function Tests: No results for input(s): "AST", "ALT", "ALKPHOS", "BILITOT", "PROT", "ALBUMIN" in the last 8760 hours. No results for input(s): "LIPASE", "AMYLASE" in the last 8760 hours. No results for input(s): "AMMONIA" in the last 8760 hours. CBC: No results for input(s): "WBC", "NEUTROABS", "HGB", "HCT", "MCV", "PLT" in the last 8760 hours. Lipid Panel: No results for input(s): "CHOL", "HDL", "LDLCALC", "TRIG", "CHOLHDL", "LDLDIRECT" in the last 8760 hours. TSH: No results for input(s): "TSH" in the last 8760 hours. A1C: Lab Results  Component Value Date   HGBA1C 5.5 04/12/2017     Assessment/Plan  1. Hyperlipidemia, unspecified hyperlipidemia type I would like to stop statin.  She is on a very low dose of a first generation statin.  We will check lipids today discontinue with daughter's approval and recheck in 3 months  2. Alzheimer disease (HCC) I think her symptoms are probably more consistent with a  vascular dementia since she has had the symptoms for almost 10 to 20 years and continues to function in the capacity that she does  3. Chronic atrial fibrillation (HCC) She continues on Eliquis but no rate limiting meds   04/14/2017, MD [facility] [phone]

## 2022-08-05 NOTE — Patient Instructions (Addendum)
Discontinue simvastatin; re-check lipids in 3 months

## 2022-08-06 LAB — COMPLETE METABOLIC PANEL WITH GFR
AG Ratio: 1.1 (calc) (ref 1.0–2.5)
ALT: 11 U/L (ref 6–29)
AST: 19 U/L (ref 10–35)
Albumin: 3.5 g/dL — ABNORMAL LOW (ref 3.6–5.1)
Alkaline phosphatase (APISO): 59 U/L (ref 37–153)
BUN/Creatinine Ratio: 29 (calc) — ABNORMAL HIGH (ref 6–22)
BUN: 28 mg/dL — ABNORMAL HIGH (ref 7–25)
CO2: 25 mmol/L (ref 20–32)
Calcium: 9.5 mg/dL (ref 8.6–10.4)
Chloride: 104 mmol/L (ref 98–110)
Creat: 0.96 mg/dL — ABNORMAL HIGH (ref 0.60–0.95)
Globulin: 3.1 g/dL (calc) (ref 1.9–3.7)
Glucose, Bld: 163 mg/dL — ABNORMAL HIGH (ref 65–139)
Potassium: 4.2 mmol/L (ref 3.5–5.3)
Sodium: 138 mmol/L (ref 135–146)
Total Bilirubin: 0.7 mg/dL (ref 0.2–1.2)
Total Protein: 6.6 g/dL (ref 6.1–8.1)
eGFR: 57 mL/min/{1.73_m2} — ABNORMAL LOW (ref >=60)

## 2022-08-06 LAB — LIPID PANEL
Cholesterol: 160 mg/dL (ref <200)
HDL: 53 mg/dL (ref >=50)
LDL Cholesterol (Calc): 85 mg/dL (calc)
Non-HDL Cholesterol (Calc): 107 mg/dL (calc) (ref <130)
Total CHOL/HDL Ratio: 3 (calc) (ref <5.0)
Triglycerides: 123 mg/dL (ref <150)

## 2022-08-06 NOTE — Progress Notes (Signed)
  This encounter was created in error - please disregard. No show 

## 2022-08-07 ENCOUNTER — Telehealth: Payer: Self-pay

## 2022-08-07 NOTE — Telephone Encounter (Signed)
Below is Dr.Miller's reply:  Frederica Kuster, MD  You 2 hours ago (12:18 PM)    Cardiology d/c'ed metoprlol and we discussed addition of Diltiazem but will not use now, and yes, statin is to be stopped    Discussed above response with patients daughter who verbalized understanding

## 2022-08-07 NOTE — Telephone Encounter (Signed)
Incoming call received from patients daughter to question medication changes made at last appointment.   Ms.Clinton (daughter) called to question if Dr.Miller wanted patient to stop simvastatin as it was still on her AVS and is currently on active medication list.  I reviewed Dr.Miller's Assessment and Plan from 08/05/2022 and it did indicate for patient to stop Simvastatin- I have now removed from active mediation list.  Ms.Clinton then states she recalls Dr.Miller saying he would d/c metoprolol and put patient on something else that would make her less sleepy.  Metoprolol was removed from medication list however, I was unable to determine what was to be prescribed in its place.  Please advise

## 2022-09-16 ENCOUNTER — Other Ambulatory Visit: Payer: Self-pay

## 2022-09-16 DIAGNOSIS — F0280 Dementia in other diseases classified elsewhere without behavioral disturbance: Secondary | ICD-10-CM

## 2022-09-16 MED ORDER — MEMANTINE HCL 10 MG PO TABS: 10.0000 mg | ORAL_TABLET | Freq: Two times a day (BID) | ORAL | 1 refills | Status: DC

## 2022-09-17 ENCOUNTER — Other Ambulatory Visit: Payer: Self-pay | Admitting: *Deleted

## 2022-09-17 DIAGNOSIS — F0280 Dementia in other diseases classified elsewhere without behavioral disturbance: Secondary | ICD-10-CM

## 2022-09-17 MED ORDER — MEMANTINE HCL 10 MG PO TABS: 10.0000 mg | ORAL_TABLET | Freq: Two times a day (BID) | ORAL | 1 refills | Status: DC

## 2022-09-17 NOTE — Telephone Encounter (Signed)
Patient daughter requested refill to be sent to [facility].

## 2022-10-29 ENCOUNTER — Other Ambulatory Visit: Payer: Self-pay | Admitting: Family Medicine

## 2022-10-29 DIAGNOSIS — F5101 Primary insomnia: Secondary | ICD-10-CM

## 2022-10-29 NOTE — Telephone Encounter (Signed)
Patient has request refill on medication Trazodone 100mg . Patient last refill dated 07/13/2022. Patient has No contract on file. Patient has upcoming appointment 11/11/2022. Sign Contract added to patient appointment note. Medication pend and sent to PCP Ngetich, 11/13/2022, NP for approval.

## 2022-10-30 ENCOUNTER — Other Ambulatory Visit: Payer: Self-pay | Admitting: *Deleted

## 2022-10-30 MED ORDER — DULOXETINE HCL 60 MG PO CPEP: 60.0000 mg | ORAL_CAPSULE | Freq: Every day | ORAL | 1 refills | Status: DC

## 2022-10-30 NOTE — Telephone Encounter (Signed)
Patient daughter requested refill.  Pended Rx and sent to [facility] for approval due to HIGH ALERT Warning.

## 2022-11-05 NOTE — Telephone Encounter (Signed)
Confirmation that pharmacy received on 10/30/2022.

## 2022-11-11 ENCOUNTER — Ambulatory Visit: Payer: Federal, State, Local not specified - PPO | Admitting: Family Medicine

## 2022-12-02 ENCOUNTER — Ambulatory Visit: Payer: Federal, State, Local not specified - PPO | Admitting: Family Medicine

## 2022-12-09 ENCOUNTER — Ambulatory Visit: Payer: Federal, State, Local not specified - PPO | Admitting: Family Medicine

## 2022-12-11 ENCOUNTER — Other Ambulatory Visit: Payer: Self-pay | Admitting: Family

## 2022-12-11 DIAGNOSIS — F5101 Primary insomnia: Secondary | ICD-10-CM

## 2022-12-11 NOTE — Telephone Encounter (Signed)
Medication refilled.

## 2022-12-14 ENCOUNTER — Other Ambulatory Visit: Payer: Self-pay | Admitting: Family

## 2022-12-21 ENCOUNTER — Other Ambulatory Visit: Payer: Self-pay

## 2022-12-21 MED ORDER — ELIQUIS 2.5 MG PO TABS: 2.5000 mg | ORAL_TABLET | Freq: Two times a day (BID) | ORAL | 0 refills | Status: DC

## 2022-12-30 ENCOUNTER — Ambulatory Visit: Payer: Federal, State, Local not specified - PPO | Admitting: Family Medicine

## 2023-01-13 ENCOUNTER — Ambulatory Visit: Payer: Federal, State, Local not specified - PPO | Admitting: Family Medicine

## 2023-02-08 ENCOUNTER — Other Ambulatory Visit: Payer: Self-pay

## 2023-02-08 MED ORDER — DULOXETINE HCL 60 MG PO CPEP: 60.0000 mg | ORAL_CAPSULE | Freq: Every day | ORAL | 1 refills | Status: DC

## 2023-02-08 NOTE — Telephone Encounter (Signed)
Patient's daughter called [id number] [place] [place] [id number] [id number]  Medication pended and sent to DMarlowe Sax NP

## 2023-02-10 ENCOUNTER — Ambulatory Visit: Payer: Federal, State, Local not specified - PPO | Admitting: Family Medicine

## 2023-02-17 ENCOUNTER — Ambulatory Visit: Payer: Federal, State, Local not specified - PPO | Admitting: Family Medicine

## 2023-02-23 ENCOUNTER — Other Ambulatory Visit: Payer: Self-pay

## 2023-02-23 MED ORDER — GABAPENTIN 100 MG PO CAPS: 100.0000 mg | ORAL_CAPSULE | Freq: Three times a day (TID) | ORAL | 3 refills | Status: AC

## 2023-02-23 NOTE — Telephone Encounter (Signed)
Patient called and states that she takes Gabapentin for her diabetic neuropathy. She states she only takes it at night as needed. Medication was taken off list for some reason. However I looked and found the dosage. Medication pend and sent to PCP Ngetich, Nelda Bucks, NP for approval.

## 2023-02-24 NOTE — Telephone Encounter (Signed)
Prescription refilled by PCP Ngetich, Nelda Bucks, NP

## 2023-03-24 ENCOUNTER — Other Ambulatory Visit: Payer: Self-pay | Admitting: Family

## 2023-03-24 DIAGNOSIS — F0280 Dementia in other diseases classified elsewhere without behavioral disturbance: Secondary | ICD-10-CM

## 2023-03-31 ENCOUNTER — Ambulatory Visit: Payer: Federal, State, Local not specified - PPO | Admitting: Family Medicine

## 2023-05-04 ENCOUNTER — Other Ambulatory Visit: Payer: Self-pay | Admitting: Family

## 2023-05-04 NOTE — Telephone Encounter (Signed)
Warning appears with medication.

## 2023-05-31 ENCOUNTER — Other Ambulatory Visit: Payer: Self-pay | Admitting: Family

## 2023-05-31 DIAGNOSIS — F0280 Dementia in other diseases classified elsewhere without behavioral disturbance: Secondary | ICD-10-CM

## 2023-06-01 ENCOUNTER — Other Ambulatory Visit: Payer: Self-pay

## 2023-06-01 MED ORDER — APIXABAN 2.5 MG PO TABS: 2.5000 mg | ORAL_TABLET | Freq: Two times a day (BID) | ORAL | 0 refills | Status: DC

## 2023-06-01 NOTE — Telephone Encounter (Signed)
Patient called requesting a 10 day supply of Eliquis [facility] [place]

## 2023-07-09 ENCOUNTER — Encounter: Payer: Self-pay | Admitting: Family Medicine

## 2023-07-12 ENCOUNTER — Encounter: Payer: Self-pay | Admitting: Family Medicine

## 2023-09-03 ENCOUNTER — Other Ambulatory Visit: Payer: Self-pay | Admitting: Family

## 2023-09-03 NOTE — Telephone Encounter (Signed)
High Risk Warning Populated when attempting to refill, I will send to Provider for further review 

## 2023-09-09 ENCOUNTER — Telehealth: Payer: Federal, State, Local not specified - PPO

## 2023-09-09 NOTE — Telephone Encounter (Signed)
Patients daughter called stating patient needs a full prescription on the duloxetine. I informed Ms.Clinton that her mother had not been seen x 1 year and needs an appointment. Patient is currently in [place] and recently got out of the hospital. Patient has a PCP in [place] and I suggested that she contact that provider for a full refill until she is able to return to [place] and resume care with Ngetich, Donalee Citrin, NP, patients daughter verbalized understanding

## 2023-10-06 ENCOUNTER — Other Ambulatory Visit: Payer: Self-pay | Admitting: Adult Health

## 2023-10-07 NOTE — Telephone Encounter (Signed)
Patient medication isnt due until Jaunary 2025. Medication pend and sent to PCP Ngetich, Donalee Citrin, NP

## 2023-10-07 NOTE — Telephone Encounter (Signed)
Eliquis is managed by cardiology.On chart review patient has established with another provider in [place] seen 04/09/2023 by [facility] [facility].

## 2024-01-16 ENCOUNTER — Other Ambulatory Visit: Payer: Self-pay | Admitting: Nurse Practitioner

## 2024-01-17 NOTE — Telephone Encounter (Signed)
 High risk warning populated when attempting to refill medication  Please review and approval if appropriate   Thanks,  [facility] W/CMA

## 2024-02-09 ENCOUNTER — Other Ambulatory Visit: Payer: Self-pay | Admitting: Family

## 2024-02-09 NOTE — Telephone Encounter (Signed)
 Patient has not been seen since 2023.will need appointment. Please verify if still our patient then update PCP.

## 2024-02-09 NOTE — Telephone Encounter (Signed)
 Patient has request refill on medication. Patient was given 15 tablets last refill. Medication pend and sent to PCP Ngetich, Donalee Citrin, NP for approval.

## 2024-09-30 DEATH — deceased
